# Patient Record
Sex: Male | Born: 1965 | Race: White | Hispanic: No | Marital: Married | Smoking: Never smoker
Health system: Southern US, Community
[De-identification: ages and names within clinical notes are randomized; demographics above are authoritative.]

## PROBLEM LIST (undated history)

## (undated) DIAGNOSIS — K429 Umbilical hernia without obstruction or gangrene: Secondary | ICD-10-CM

## (undated) DIAGNOSIS — M1990 Unspecified osteoarthritis, unspecified site: Secondary | ICD-10-CM

## (undated) DIAGNOSIS — Z9889 Other specified postprocedural states: Secondary | ICD-10-CM

## (undated) DIAGNOSIS — S32019A Unspecified fracture of first lumbar vertebra, initial encounter for closed fracture: Secondary | ICD-10-CM

## (undated) DIAGNOSIS — R112 Nausea with vomiting, unspecified: Secondary | ICD-10-CM

## (undated) DIAGNOSIS — I10 Essential (primary) hypertension: Secondary | ICD-10-CM

## (undated) DIAGNOSIS — J189 Pneumonia, unspecified organism: Secondary | ICD-10-CM

## (undated) DIAGNOSIS — Z8709 Personal history of other diseases of the respiratory system: Secondary | ICD-10-CM

## (undated) DIAGNOSIS — M722 Plantar fascial fibromatosis: Secondary | ICD-10-CM

## (undated) DIAGNOSIS — K4090 Unilateral inguinal hernia, without obstruction or gangrene, not specified as recurrent: Secondary | ICD-10-CM

## (undated) DIAGNOSIS — Z889 Allergy status to unspecified drugs, medicaments and biological substances status: Secondary | ICD-10-CM

## (undated) HISTORY — PX: HERNIA REPAIR: SHX51

## (undated) HISTORY — PX: WISDOM TOOTH EXTRACTION: SHX21

---

## 2006-10-23 ENCOUNTER — Ambulatory Visit (HOSPITAL_COMMUNITY): Admission: RE | Admit: 2006-10-23 | Discharge: 2006-10-23 | Payer: Self-pay | Admitting: General Surgery

## 2010-03-19 ENCOUNTER — Emergency Department (HOSPITAL_COMMUNITY): Admission: EM | Admit: 2010-03-19 | Discharge: 2010-03-19 | Payer: Self-pay | Admitting: Family Medicine

## 2011-03-29 NOTE — Op Note (Signed)
Benjamin Hogan, Benjamin Hogan             ACCOUNT NO.:  [id number]   MEDICAL RECORD NO.:  [id number]          PATIENT TYPE:  AMB   LOCATION:  DAY                          FACILITY:  [facility] [place]   PHYSICIAN:  Ollen Gross. Vernell Morgans, M.D. DATE OF BIRTH:  08/31/1966   DATE OF PROCEDURE:  10/23/2006  DATE OF DISCHARGE:                               OPERATIVE REPORT   PREOPERATIVE DIAGNOSIS:  Left inguinal hernia and umbilical hernia.   POSTOPERATIVE DIAGNOSIS:  Left inguinal hernia and umbilical hernia.   PROCEDURE:  Left inguinal hernia repair with mesh, umbilical hernia  repair with mesh.   SURGEON:  Ollen Gross. Vernell Morgans, M.D.   ANESTHESIA:  General endotracheal.   DESCRIPTION OF PROCEDURE:  After informed consent was obtained, the  patient was brought to the operating room and placed in the supine  position on the operating table.  After induction of general anesthesia,  the patient's abdomen and left groin area were prepped with Betadine and  draped in the usual sterile manner.  The left groin was then infiltrated  with 0.25% Marcaine.  An incision was made from the edge of the pubic  tubercle on the left towards the anterior superior iliac spine for a  distance of about 5-6 cm. This incision was carried down through the  skin and subcutaneous tissues sharply with electrocautery until the  fascia of the external oblique was encountered.  A small bridging vein  was clamped with hemostats, divided, and ligated with 3-0 silk ties.  The fascia of the external oblique was opened along its fibers towards  apex of the external ring with a 15 blade knife and Metzenbaum scissors.  A Weitlaner retractor was deployed.   Blunt dissection was carried out of the cord structures until the cord  structures could be surrounded between two fingers.  A 1/2-inch Penrose  drain was placed around the cord structures.  The cord was then given  gently skeletonized by a combination of blunt hemostat dissection and  sharp  dissection with electrocautery.  A hernia sac was identified at  the medial edge of the cord extending into the direct portion of the  inguinal canal.  This is a very broad based hernia sac that was easily  reduced.  The floor of the inguinal canal was opened sharply with  electrocautery and blunt finger dissection was performed to redefine the  floor of the canal. The floor of the canal was then repaired with a  running 2-0 Prolene stitch.  The tails of the stitch were left long at  the edge of the cord.  A 6 by 6 piece of Ultrapro mesh was then cut in  half and then cut to fit.  The mesh was sewn inferiorly to the shelving  edge of the inguinal ligament with a running 2-0 Prolene stitch.  The  tails of the previous Prolene were brought through the mesh at the edge  of the cord and tied down.  The tails were cut in the mesh laterally and  the tails were wrapped around the cord superiorly.  The mesh was sewn to  the  muscle aponeurotic strength layer of the transversalis with  interrupted 2-0 Prolene vertical mattress stitches and the tails of the  mesh were anchored lateral the cord to the shelving edge of the inguinal  ligament with interrupted 2-0 Prolene stitch.  Once this was  accomplished, the mesh was in good position without any tension.  The  wound was irrigated copious amounts of saline.  The external oblique was  then reapproximated with a running 2-0 Vicryl stitch.  The subcutaneous  fascia was closed with a running 3-0 Vicryl stitch and the skin was  closed with a running 4-0 Monocryl subcuticular stitch.   Attention was then turned to the umbilicus.  There was a small hernia  defect that was palpable at the umbilicus.  The periumbilical area was  infiltrated with 0.25% Marcaine.  A small incision was made vertically  through the umbilicus.  This incision was carried down through the skin  and subcutaneous tissue sharply with electrocautery.  The hernia sac was  identified and  opened sharply with electrocautery.  There were no  visceral contents within the hernia sac. The fat in the hernia sac was  reduced easily.  The fascial edges were healthy.  The subcutaneous  tissue was undermined on top of the fascia circumferentially with the  electrocautery to allow space for a piece of mesh.  The hernia defect,  itself, was repaired with interrupted #1 Novofil stitches.  A small  piece of mesh was cut to overlie this and placed in the wound.  The mesh  was anchored in several places with interrupted #1 Novofil stitches so  that the mesh was completely covering the repair. Once this was  accomplished, the wound was irrigated with copious amounts of saline.  The deep layer of the wound was closed with interrupted 3-0 Vicryl  stitches.  The skin was closed a running 4-0 Monocryl subcuticular  stitch.  Benzoin, Steri-Strips and sterile dressings were applied.  The  patient tolerated the procedure well.  At the end of the case, all  needle, sponge, and instrument counts were correct.  The patient was  then awakened and taken to the recovery room in stable condition.      Ollen Gross. Vernell Morgans, M.D.  Electronically Signed     PST/MEDQ  D:  10/24/2006  T:  10/25/2006  Job:  [id number]

## 2011-12-05 ENCOUNTER — Emergency Department (INDEPENDENT_AMBULATORY_CARE_PROVIDER_SITE_OTHER)
Admission: EM | Admit: 2011-12-05 | Discharge: 2011-12-05 | Disposition: A | Discharge status: Home / Self Care | Source: Home / Self Care | Attending: Family Medicine | Admitting: Family Medicine

## 2011-12-05 ENCOUNTER — Encounter (HOSPITAL_COMMUNITY): Payer: Self-pay | Admitting: Emergency Medicine

## 2011-12-05 DIAGNOSIS — R591 Generalized enlarged lymph nodes: Secondary | ICD-10-CM

## 2011-12-05 DIAGNOSIS — R599 Enlarged lymph nodes, unspecified: Secondary | ICD-10-CM

## 2011-12-05 HISTORY — DX: Plantar fascial fibromatosis: M72.2

## 2011-12-05 HISTORY — DX: Essential (primary) hypertension: I10

## 2011-12-05 MED ORDER — AMOXICILLIN 500 MG PO CAPS: 500.0000 mg | ORAL_CAPSULE | Freq: Three times a day (TID) | ORAL | Status: AC

## 2011-12-05 NOTE — ED Provider Notes (Signed)
History     CSN: [id number]  Arrival date & time 12/05/11  1453   First MD Initiated Contact with Patient 12/05/11 1537      Chief Complaint  Patient presents with  . Sore Throat  . Lymphadenopathy    (Consider location/radiation/quality/duration/timing/severity/associated sxs/prior treatment) HPI Comments: The patient reports having a sore throat and swelling of the left side of his neck . Had cold symptoms for a wk. No fever. Swelling started 2 days ago. No cough. No treatment pta.   The history is provided by the patient.    Past Medical History  Diagnosis Date  . Hypertension   . Plantar fasciitis     Past Surgical History  Procedure Date  . Hernia repair     History reviewed. No pertinent family history.  History  Substance Use Topics  . Smoking status: Never Smoker   . Smokeless tobacco: Not on file  . Alcohol Use: No      Review of Systems  Constitutional: Negative.   HENT: Negative for ear pain, congestion, rhinorrhea, sneezing and mouth sores.   Respiratory: Negative.   Cardiovascular: Negative.   Gastrointestinal: Negative.   Skin: Negative.     Allergies  Review of patient's allergies indicates no known allergies.  Home Medications   Current Outpatient Rx  Name Route Sig Dispense Refill  . LISINOPRIL 40 MG PO TABS Oral Take 40 mg by mouth daily.    Marland Kitchen NAPROXEN 500 MG PO TABS Oral Take 500 mg by mouth 2 (two) times daily with a meal.      BP 135/72  Pulse 80  Temp(Src) 98.4 F (36.9 C) (Oral)  Resp 18  SpO2 96%  Physical Exam  Nursing note and vitals reviewed. Constitutional: He appears well-developed and well-nourished. No distress.  HENT:  Head: Normocephalic.  Nose: Nose normal.  Mouth/Throat: Oropharynx is clear and moist.  Neck: Neck supple.       Swelling near left mandibular angle. Slight tenderness to palpation. Fullness over the parotid noted. Skin clear    ED Course  Procedures (including critical care time)  Labs  Reviewed - No data to display No results found.   1. Lymphadenopathy       MDM          Randa Spike, MD 12/05/11 [phone]

## 2011-12-05 NOTE — ED Notes (Signed)
PT HERE WITH SORE THROAT AND LEFT NECK SWELLING S/P URI X 1 WEEK AGO.PT STATES THE SWELLING STARTED X 2 DYS AGO.NO FEVER,CHILLS,N,V.

## 2012-08-18 ENCOUNTER — Encounter (HOSPITAL_COMMUNITY): Payer: Self-pay | Admitting: *Deleted

## 2012-08-18 ENCOUNTER — Emergency Department (INDEPENDENT_AMBULATORY_CARE_PROVIDER_SITE_OTHER)
Admission: EM | Admit: 2012-08-18 | Discharge: 2012-08-18 | Disposition: A | Discharge status: Home / Self Care | Source: Home / Self Care | Attending: Family Medicine | Admitting: Family Medicine

## 2012-08-18 DIAGNOSIS — S335XXA Sprain of ligaments of lumbar spine, initial encounter: Secondary | ICD-10-CM

## 2012-08-18 DIAGNOSIS — S39012A Strain of muscle, fascia and tendon of lower back, initial encounter: Secondary | ICD-10-CM

## 2012-08-18 LAB — POCT URINALYSIS DIP (DEVICE)
Ketones, ur: NEGATIVE mg/dL
Protein, ur: NEGATIVE mg/dL
Specific Gravity, Urine: 1.015 (ref 1.005–1.030)
pH: 6 (ref 5.0–8.0)

## 2012-08-18 MED ORDER — CYCLOBENZAPRINE HCL 5 MG PO TABS
5.0000 mg | ORAL_TABLET | Freq: Three times a day (TID) | ORAL | Status: DC | PRN
Start: 2012-08-18 — End: 2013-09-08

## 2012-08-18 NOTE — ED Notes (Signed)
PT reports back pain for the past 10 days that has been unrelieved with stretching, flexeril, jacuzzis, swimming. 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

## 2012-08-18 NOTE — ED Provider Notes (Signed)
History     CSN: [id number]  Arrival date & time 08/18/12  1353   First MD Initiated Contact with Patient 08/18/12 1444      Chief Complaint  Patient presents with  . Back Pain    (Consider location/radiation/quality/duration/timing/severity/associated sxs/prior treatment) Patient is a 46 y.o. male presenting with back pain. The history is provided by the patient.  Back Pain  This is a new problem. The current episode started more than 1 week ago. The problem has been gradually improving (75% improved at present.). The pain is associated with no known injury. The pain is present in the lumbar spine. The quality of the pain is described as stabbing. The pain does not radiate. The pain is mild. The symptoms are aggravated by certain positions. Pertinent negatives include no chest pain, no numbness, no abdominal pain, no bowel incontinence, no bladder incontinence, no leg pain, no paresthesias and no tingling. He has tried muscle relaxants, heat and NSAIDs for the symptoms.    Past Medical History  Diagnosis Date  . Hypertension   . Plantar fasciitis     Past Surgical History  Procedure Date  . Hernia repair     Family History  Problem Relation Age of Onset  . Family history unknown: Yes    History  Substance Use Topics  . Smoking status: Never Smoker   . Smokeless tobacco: Not on file  . Alcohol Use: No      Review of Systems  Cardiovascular: Negative for chest pain.  Gastrointestinal: Negative for abdominal pain and bowel incontinence.  Genitourinary: Negative for bladder incontinence.  Musculoskeletal: Positive for back pain.  Neurological: Negative for tingling, numbness and paresthesias.    Allergies  Review of patient's allergies indicates no known allergies.  Home Medications   Current Outpatient Rx  Name Route Sig Dispense Refill  . CYCLOBENZAPRINE HCL 10 MG PO TABS Oral Take 10 mg by mouth 3 (three) times daily as needed.    Marland Kitchen DICLOFENAC SODIUM 25 MG  PO TBEC Oral Take 25 mg by mouth 2 (two) times daily.    Marland Kitchen HYDROCHLOROTHIAZIDE 25 MG PO TABS Oral Take 25 mg by mouth daily.    Marland Kitchen LISINOPRIL 40 MG PO TABS Oral Take 40 mg by mouth daily.    . CYCLOBENZAPRINE HCL 5 MG PO TABS Oral Take 1 tablet (5 mg total) by mouth 3 (three) times daily as needed for muscle spasms. 30 tablet 0  . NAPROXEN 500 MG PO TABS Oral Take 500 mg by mouth 2 (two) times daily with a meal.      Pulse 69  Temp 98.6 F (37 C) (Oral)  Resp 16  SpO2 98%  Physical Exam  Nursing note and vitals reviewed. Constitutional: He is oriented to person, place, and time. He appears well-developed and well-nourished. No distress.  Neck: Normal range of motion. Neck supple.  Abdominal: Soft. Bowel sounds are normal.  Musculoskeletal: He exhibits tenderness.       Lumbar back: He exhibits tenderness and spasm.       Back:  Neurological: He is alert and oriented to person, place, and time. He has normal reflexes.  Skin: Skin is warm and dry.    ED Course  Procedures (including critical care time)   Labs Reviewed  POCT URINALYSIS DIP (DEVICE)   No results found.   1. Strain of lumbar paraspinous muscle       MDM  U/a neg        Linna Hoff,  MD 08/18/12 1529

## 2013-09-08 ENCOUNTER — Encounter: Payer: Self-pay | Admitting: Internal Medicine

## 2013-09-08 ENCOUNTER — Ambulatory Visit (INDEPENDENT_AMBULATORY_CARE_PROVIDER_SITE_OTHER): Admitting: Internal Medicine

## 2013-09-08 VITALS — BP 132/90 | HR 55 | Ht 76.0 in | Wt 299.4 lb

## 2013-09-08 DIAGNOSIS — R5381 Other malaise: Secondary | ICD-10-CM

## 2013-09-08 DIAGNOSIS — R6889 Other general symptoms and signs: Secondary | ICD-10-CM

## 2013-09-08 DIAGNOSIS — Z8249 Family history of ischemic heart disease and other diseases of the circulatory system: Secondary | ICD-10-CM

## 2013-09-08 DIAGNOSIS — R5383 Other fatigue: Secondary | ICD-10-CM

## 2013-09-08 DIAGNOSIS — I10 Essential (primary) hypertension: Secondary | ICD-10-CM

## 2013-09-08 DIAGNOSIS — R0609 Other forms of dyspnea: Secondary | ICD-10-CM

## 2013-09-08 NOTE — Progress Notes (Signed)
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 He is followed by the [place] in [place] and was referred for increasing shortness of breath and decreased exercise tolerance. His family history is significant for heart disease including a father who has hypertension a mother with a failed heart failure dyslipidemia and a history of hypertension and heart disease in his sisters. He is really noticed some increased weight gain difficulty in exercising with increasing shortness of breath and  Markedly decreased exercise tolerance. He has to continue to undergo tests are physical fitness with the [employer] and his performance has continued to decline. He also notes that normal activities make him more short of breath than he had in the past. He has no history of smoking and denies alcohol recreational drug use. He has been deployed to [place] but denies any chemical or infectious agent exposure. He also does not have any typical anginal symptoms. I also reviewed her lipid profile recently from the [place] which showed total cholesterol 166 and LDL cholesterol of 100 - is not on statin medications.  PMHx:  Past Medical History  Diagnosis Date  . Hypertension   . Plantar fasciitis     Past Surgical History  Procedure Laterality Date  . Hernia repair      FAMHx:  Family History  Problem Relation Age of Onset  . Atrial fibrillation Mother   . Heart failure Mother   . Hypertension Mother   . Hyperlipidemia Mother   . Heart disease Mother   . Hypertension Father   . Hyperlipidemia Father   . Hypertension Sister   . Hyperlipidemia Brother     SOCHx:   reports that he has never smoked.  He has never used smokeless tobacco. He reports that he drinks alcohol. He reports that he does not use illicit drugs.  ALLERGIES:  No Known Allergies  ROS: A comprehensive review of systems was negative except for: Constitutional: positive for fatigue Respiratory: positive for dyspnea on exertion  HOME MEDS: Current Outpatient Prescriptions  Medication Sig Dispense Refill  . cyclobenzaprine (FLEXERIL) 10 MG tablet Take 10 mg by mouth 3 (three) times daily as needed.      . diclofenac (VOLTAREN) 25 MG EC tablet Take 75 mg by mouth 2 (two) times daily as needed.       . fexofenadine (ALLEGRA) 180 MG tablet Take 180 mg by mouth daily.      . hydrochlorothiazide (HYDRODIURIL) 25 MG tablet Take 25 mg by mouth daily.      Marland Kitchen lisinopril (PRINIVIL,ZESTRIL) 40 MG tablet Take 40 mg by mouth daily.       No current facility-administered medications for this visit.    LABS/IMAGING: No results found for this or any previous visit (from the past 48 hour(s)). No results found.  VITALS: BP 132/90  Pulse 55  Ht 6\' 4"  (1.93 m)  Wt 299 lb 6.4 oz (135.807 kg)  BMI 36.46 kg/m2  EXAM: General appearance: alert, no distress and appears muscular Neck: no carotid bruit and no JVD Lungs: clear to auscultation bilaterally Heart: regular rate and rhythm, S1, S2 normal and systolic murmur: early systolic 2/6, crescendo at 2nd right  intercostal space Abdomen: soft, non-tender; bowel sounds normal; no masses,  no organomegaly Extremities: extremities normal, atraumatic, no cyanosis or edema Pulses: 2+ and symmetric Skin: Skin color, texture, turgor normal. No rashes or lesions Neurologic: Grossly normal Psych: Mood, affect normal  EKG: Sinus bradycardia at 55  ASSESSMENT: 1. Decreased exercise tolerance and increasing shortness of breath with exertion 2. Fatigue 3. Recent weight gain 4. Hypertension-controlled  PLAN: 1.   Benjamin Hogan is complaining of decreased exercise tolerance and  increased shortness of breath with exertion over the past several months. 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 There is history on family history of heart disease and he does have risk factors such as hypertension and age. He's never had any stress testing before and desires to start an exercise program. Would like for him to undergo an exercise nuclear stress test. This goes a good idea about his exercise capacity to be helpful in excluding possible coronary disease.  Contacted with results of the stress test and plan to follow him annually at least for his factor modification.  Chrystie Nose, MD, [facility] Attending Cardiologist [facility]  Benjamin Hogan,Benjamin Hogan 09/08/2013, 9:54 AM

## 2013-09-08 NOTE — Patient Instructions (Signed)
Your physician has requested that you have en exercise stress myoview. For further information please visit [email or web address] Please follow instruction sheet, as given. We will call you with the results.  Your physician wants you to follow-up in: 1 year. You will receive a reminder letter in the mail two months in advance. If you don't receive a letter, please call our office to schedule the follow-up appointment.

## 2013-09-10 ENCOUNTER — Ambulatory Visit (HOSPITAL_COMMUNITY)
Admission: RE | Admit: 2013-09-10 | Discharge: 2013-09-10 | Disposition: A | Discharge status: Home / Self Care | Source: Ambulatory Visit | Attending: Internal Medicine | Admitting: Internal Medicine

## 2013-09-10 DIAGNOSIS — R5381 Other malaise: Secondary | ICD-10-CM | POA: Insufficient documentation

## 2013-09-10 DIAGNOSIS — R0989 Other specified symptoms and signs involving the circulatory and respiratory systems: Secondary | ICD-10-CM | POA: Insufficient documentation

## 2013-09-10 DIAGNOSIS — R5383 Other fatigue: Secondary | ICD-10-CM

## 2013-09-10 DIAGNOSIS — I10 Essential (primary) hypertension: Secondary | ICD-10-CM | POA: Insufficient documentation

## 2013-09-10 DIAGNOSIS — R0609 Other forms of dyspnea: Secondary | ICD-10-CM | POA: Insufficient documentation

## 2013-09-10 MED ORDER — TECHNETIUM TC 99M SESTAMIBI GENERIC - CARDIOLITE
10.0000 | Freq: Once | INTRAVENOUS | Status: AC | PRN
  Administered 2013-09-10: 10 via INTRAVENOUS

## 2013-09-10 MED ORDER — TECHNETIUM TC 99M SESTAMIBI GENERIC - CARDIOLITE
30.0000 | Freq: Once | INTRAVENOUS | Status: AC | PRN
  Administered 2013-09-10: 30 via INTRAVENOUS

## 2013-09-10 NOTE — Procedures (Addendum)
[facility] [address] [place] [address] [place] [address] [phone]  Cardiology Nuclear Med Study  Benjamin Hogan is a 47 y.o. male     MRN : [id number]     DOB: 07-14-1966  Procedure Date: 09/10/2013  Nuclear Med Background Indication for Stress Test:  Evaluation for Ischemia and Abnormal EKG History:  No prior history reported. Cardiac Risk Factors: Family History - CAD, Hypertension, Lipids and Overweight  Symptoms:  Dizziness, DOE, Fatigue and Light-Headedness   Nuclear Pre-Procedure Caffeine/Decaff Intake:  7:00pm NPO After: 5:00am   IV Site: R Hand  IV 0.9% NS with Angio Cath:  22g  Chest Size (in):  44"  IV Started by: Emmit Pomfret, RN  Height: 6\' 4"  (1.93 m)  Cup Size: n/a  BMI:  Body mass index is 36.41 kg/(m^2). Weight:  299 lb (135.626 kg)   Tech Comments:  n/a    Nuclear Med Study 1 or 2 day study: 1 day  Stress Test Type:  Stress  Order Authorizing Provider:  Zoila Shutter, MD   Resting Radionuclide: Technetium 24m Sestamibi  Resting Radionuclide Dose: 10.4 mCi   Stress Radionuclide:  Technetium 92m Sestamibi  Stress Radionuclide Dose: 31.5 mCi           Stress Protocol Rest HR: 54 Stress HR:151  Rest BP: 121/79 Stress BP:191/53  Exercise Time (min): 12:36 METS: 13.40   Predicted Max HR: 175 bpm % Max HR: 86.29 bpm Rate Pressure Product: 46962  Dose of Adenosine (mg):  n/a Dose of Lexiscan: 0.4 mg  Dose of Atropine (mg): n/a Dose of Dobutamine: n/a mcg/kg/min (at max HR)  Stress Test Technologist: Ernestene Mention, CCT Nuclear Technologist: Koren Shiver, CNMT   Rest Procedure:  Myocardial perfusion imaging was performed at rest 45 minutes following the intravenous administration of Technetium 80m Sestamibi. Stress Procedure:  The patient performed treadmill exercise using a Bruce  Protocol for 12 minutes and 36 seconds. The patient stopped due to shortness of breath and fatigue. Patient denied any  chest pain.  There were no significant ST-T wave changes.  Technetium 32m Sestamibi was injected at peak exercise and myocardial perfusion imaging was performed after a brief delay.  Transient Ischemic Dilatation (Normal <1.22):  1.09 Lung/Heart Ratio (Normal <0.45):  0.42 QGS EDV:  166 ml QGS ESV:  79 ml LV Ejection Fraction: 52%     Rest ECG: NSR with non-specific ST-T wave changes  Stress ECG: No significant change from baseline ECG  QPS Raw Data Images:  Normal; no motion artifact; normal heart/lung ratio. Cavity to wall ratio appears increased, although overall LV volume is not increased. Stress Images:  Normal homogeneous uptake in all areas of the myocardium. Rest Images:  Normal homogeneous uptake in all areas of the myocardium. Subtraction (SDS):  No evidence of ischemia. LV Wall Motion:  Mild global hypokinesis and mildly depressed overall systolic function. EF 52%.  Impression Exercise Capacity:  Good exercise capacity. BP Response:  Hypertensive blood pressure response. Clinical Symptoms:  No significant symptoms noted. ECG Impression:  Insignificant upsloping ST segment depression. Comparison with Prior Nuclear Study: No previous nuclear study performed   Overall Impression:  Normal stress nuclear study. 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   Thurmon Fair, MD  09/10/2013 12:40 PM

## 2015-03-27 ENCOUNTER — Ambulatory Visit (INDEPENDENT_AMBULATORY_CARE_PROVIDER_SITE_OTHER): Admitting: Physician Assistant

## 2015-03-27 VITALS — BP 130/84 | HR 83 | Temp 98.0°F | Resp 16 | Ht 76.0 in | Wt 316.0 lb

## 2015-03-27 DIAGNOSIS — M722 Plantar fascial fibromatosis: Secondary | ICD-10-CM | POA: Insufficient documentation

## 2015-03-27 DIAGNOSIS — L03011 Cellulitis of right finger: Secondary | ICD-10-CM | POA: Diagnosis not present

## 2015-03-27 DIAGNOSIS — S22000A Wedge compression fracture of unspecified thoracic vertebra, initial encounter for closed fracture: Secondary | ICD-10-CM | POA: Diagnosis not present

## 2015-03-27 MED ORDER — DOXYCYCLINE HYCLATE 100 MG PO CAPS: 100.0000 mg | ORAL_CAPSULE | Freq: Two times a day (BID) | ORAL | Status: AC

## 2015-03-27 MED ORDER — DICLOFENAC SODIUM 25 MG PO TBEC: 75.0000 mg | DELAYED_RELEASE_TABLET | Freq: Two times a day (BID) | ORAL | Status: DC | PRN

## 2015-03-27 NOTE — Progress Notes (Signed)
Subjective:   Patient ID: Benjamin Hogan, male     DOB: Apr 10, 1966, 49 y.o.    MRN: [id number]  PCP: Pcp Not In System  Chief Complaint  Patient presents with  . Wound Infection    R thumb x 1 wk    HPI  Presents for evaluation of a painful RIGHT thumb.  About a week ago, he accidentally tore the medial nailfold. He's been caring for it at home but it's not improving. 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 The area is red and swollen, but he's had no purulent drainage, no fever/chills.  His PCP is at the [place], and he requests a refill of Diclofenac, which he uses PRN for back pain.  Tdap was 2013.  Prior to Admission medications   Medication Sig Start Date End Date Taking? Authorizing Provider  diclofenac (VOLTAREN) 25 MG EC tablet Take 3 tablets (75 mg total) by mouth 2 (two) times daily as needed. 03/27/15  Yes Marquelle Balow Dellis Filbert, PA-C  fexofenadine (ALLEGRA) 180 MG tablet Take 180 mg by mouth daily.   Yes Historical Provider, MD  hydrochlorothiazide (HYDRODIURIL) 25 MG tablet Take 25 mg by mouth daily.   Yes Historical Provider, MD  lisinopril (PRINIVIL,ZESTRIL) 40 MG tablet Take 40 mg by mouth daily.   Yes Historical Provider, MD  doxycycline (VIBRAMYCIN) 100 MG capsule Take 1 capsule (100 mg total) by mouth 2 (two) times daily. 03/27/15 04/06/15  Daphane Shepherd, PA-C     No Known Allergies   Patient Active Problem List   Diagnosis Date Noted  . Compression fx, thoracic spine 03/27/2015  . Plantar fasciitis, bilateral 03/27/2015  . DOE (dyspnea on exertion) 09/08/2013  . Decreased exercise tolerance 09/08/2013  . HTN (hypertension) 09/08/2013  . Family history of premature CAD 09/08/2013     Family History  Problem Relation Age of Onset  . Atrial fibrillation Mother   . Heart failure Mother   . Hypertension Mother   . Hyperlipidemia Mother   . Heart disease Mother   . Diabetes Mother   . Hypertension Father   . Hyperlipidemia  Father   . Hypertension Sister   . Hyperlipidemia Brother   . Diabetes Maternal Grandmother   . Heart disease Maternal Grandmother   . Hyperlipidemia Maternal Grandmother   . Hypertension Maternal Grandmother   . Stroke Maternal Grandmother   . Heart disease Maternal Grandfather   . Hypertension Paternal Grandmother   . Stroke Paternal Grandfather   . Hypertension Sister      History   Social History  . Marital Status: Married    Spouse Name: Ja Ohman  . Number of Children: 2  . Years of Education: N/A   Occupational History  . ICU Nurse     [place] History Main Topics  . Smoking status: Never Smoker   . Smokeless tobacco: Never Used  . Alcohol Use: Yes     Comment: occasionally  . Drug Use: No  . Sexual Activity: Not on file   Other Topics Concern  . Not on file   Social History Narrative   Lives with his wife and their daughters, [place]quel [place] and [place].        Review of Systems  Constitutional: Negative for fever, chills and fatigue.  Musculoskeletal: Negative for myalgias, joint swelling and arthralgias.  Skin: Positive for wound.  Neurological: Negative for numbness.         Objective:  Physical Exam  Constitutional: He is oriented to person, place, and time. He appears well-developed and well-nourished. He is active and cooperative. No distress.  BP 130/84 mmHg  Pulse 83  Temp(Src) 98 F (36.7 C) (Oral)  Resp 16  Ht 6\' 4"  (1.93 m)  Wt 316 lb (143.337 kg)  BMI 38.48 kg/m2  SpO2 98%   Eyes: Conjunctivae are normal.  Pulmonary/Chest: Effort normal.  Musculoskeletal:       Right hand: He exhibits normal range of motion, no tenderness and no bony tenderness. Normal sensation noted. Normal strength noted.  Erythema of the nail fold, wound consistent with avulsion of skin. Tender. Mild surrounding erythema. No induration. No red streaking.  Neurological: He is alert and oriented to person, place, and time.  Psychiatric: He has a  normal mood and affect. His speech is normal and behavior is normal.             Assessment & Plan:  1. Paronychia of right thumb Secondary to accidental trauma. Local wound care. Anticipatory guidance.  - doxycycline (VIBRAMYCIN) 100 MG capsule; Take 1 capsule (100 mg total) by mouth 2 (two) times daily.  Dispense: 20 capsule; Refill: 0  2. Compression fx, thoracic spine, closed, initial encounter Stable. - diclofenac (VOLTAREN) 25 MG EC tablet; Take 3 tablets (75 mg total) by mouth 2 (two) times daily as needed.  Dispense: 60 tablet; Refill: 0   Fara Chute, PA-C Physician Assistant-Certified [facility]

## 2015-03-27 NOTE — Patient Instructions (Addendum)
Complete the antibiotic as prescribed. Apply a warm compress to the area for 15-20 minutes 2-4 times each day. Cover when you are at work, but make sure that the wound is getting plenty of fresh air!

## 2015-06-06 ENCOUNTER — Ambulatory Visit: Payer: Self-pay | Admitting: Surgery

## 2015-06-06 NOTE — H&P (Signed)
History of Present Illness Benjamin Hogan. Benjamin Schwinn MD; 06/06/2015 12:21 PM) The patient is a 49 year old male who presents with an inguinal hernia. Patient referred by Dr. Jackson Latino for evaluation of recurrent left inguinal hernia. 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 In 2007 he underwent open left inguinal hernia repair with mesh by Dr. Marlou Starks. Apparently he had a recurrence and in 2009 he underwent laparoscopic bilateral inguinal hernia repairs with mesh by an Multimedia programmer at [facility]. Over the last year or so he has noticed some tenderness in his left groin. 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 The MRI at that time showed a stress fracture of the left pubic bone with significant edema around the pubic symphysis. It also showed a recurrent hernia on the left. It is unclear whether the hernia occurred at the time of the accident. This area has become more tender and he continues to be able to reduce this area. He denies any obstructive symptoms. He presents now to discuss a another repair. The patient's father as well as his brother have both had multiple hernia repairs and recurrences.   Other Problems Elbert Ewings, CMA; 06/06/2015 9:38 AM) Back Pain High blood pressure Inguinal Hernia Umbilical Hernia Repair  Past Surgical History Elbert Ewings, CMA; 06/06/2015 9:38 AM) Laparoscopic Inguinal Hernia Surgery Bilateral. Open Inguinal Hernia Surgery Left. Oral Surgery Ventral / Umbilical Hernia Surgery Bilateral.  Diagnostic Studies History Elbert Ewings, CMA; 06/06/2015 9:38 AM) Colonoscopy never  Allergies Elbert Ewings, CMA; 06/06/2015 9:38 AM) No Known Drug Allergies07/26/2016  Medication History Elbert Ewings, CMA; 06/06/2015 9:40 AM) Lisinopril (40MG  Tablet, Oral)  Active. Hydrochlorothiazide (25MG  Tablet, Oral) Active. EpiPen 2-Pak (0.3MG /0.3ML Soln Auto-inj, Injection AS needed) Active. Diclofenac Sodium ER (100MG  Tablet ER 24HR, Oral) Active. Allegra (180MG  Tablet, Oral) Active. Medications Reconciled  Social History Elbert Ewings, [place]; 06/06/2015 9:38 AM) Alcohol use Occasional alcohol use. Caffeine use Carbonated beverages. No drug use Tobacco use Never smoker.  Family History Elbert Ewings, [place]; 06/06/2015 9:38 AM) Cancer Mother. Diabetes Mellitus Mother. Heart Disease Mother. Hypertension Father, Mother, Sister. Kidney Disease Mother.  Review of Systems Elbert Ewings CMA; 06/06/2015 9:38 AM) General Not Present- Appetite Loss, Chills, Fatigue, Fever, Night Sweats, Weight Gain and Weight Loss. Skin Not Present- Change in Wart/Mole, Dryness, Hives, Jaundice, New Lesions, Non-Healing Wounds, Rash and Ulcer. HEENT Present- Hearing Loss, Ringing in the Ears, Seasonal Allergies and Wears glasses/contact lenses. Not Present- Earache, Hoarseness, Nose Bleed, Oral Ulcers, Sinus Pain, Sore Throat, Visual Disturbances and Yellow Eyes. Respiratory Present- Snoring. Not Present- Bloody sputum, Chronic Cough, Difficulty Breathing and Wheezing. Cardiovascular Not Present- Chest Pain, Difficulty Breathing Lying Down, Leg Cramps, Palpitations, Rapid Heart Rate, Shortness of Breath and Swelling of Extremities. Gastrointestinal Not Present- Abdominal Pain, Bloating, Bloody Stool, Change in Bowel Habits, Chronic diarrhea, Constipation, Difficulty Swallowing, Excessive gas, Gets full quickly at meals, Hemorrhoids, Indigestion, Nausea, Rectal Pain and Vomiting. Male Genitourinary Not Present- Blood in Urine, Change in Urinary Stream, Frequency, Impotence, Nocturia, Painful Urination, Urgency and Urine Leakage. Musculoskeletal Present- Back Pain and Joint Pain. Not Present- Joint Stiffness, Muscle Pain, Muscle Weakness and Swelling of  Extremities. Neurological Not Present- Decreased Memory, Fainting, Headaches, Numbness, Seizures, Tingling, Tremor, Trouble walking and Weakness. Psychiatric Not Present- Anxiety, Bipolar, Change in Sleep Pattern, Depression, Fearful and Frequent crying.  Endocrine Not Present- Cold Intolerance, Excessive Hunger, Hair Changes, Heat Intolerance, Hot flashes and New Diabetes. Hematology Not Present- Easy Bruising, Excessive bleeding, Gland problems, HIV and Persistent Infections.   Vitals Elbert Ewings CMA; 06/06/2015 9:40 AM) 06/06/2015 9:40 AM Weight: 321 lb Height: 76in Body Surface Area: 2.79 m Body Mass Index: 39.07 kg/m Temp.: 98.83F(Oral)  Pulse: 74 (Regular)  BP: 138/80 (Sitting, Left Arm, Standard)    Physical Exam Rodman Key K. Tarena Gockley MD; 06/06/2015 12:22 PM) The physical exam findings are as follows: Note:WDWN in NAD HEENT: EOMI, sclera anicteric Neck: No masses, no thyromegaly Lungs: CTA bilaterally; normal respiratory effort CV: Regular rate and rhythm; no murmurs Abd: +bowel sounds, soft, non-tender, healed umbilical incision GU: bilateral descended testes; no testicular masses; no sign of right inguinal hernia; reducible left inguinal hernia Ext: Well-perfused; no edema Skin: Warm, dry; no sign of jaundice    Assessment & Plan Rodman Key K. 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 The surgical procedure has been discussed with the patient. Potential risks, benefits, alternative treatments, and expected outcomes have been explained. All of the patient's questions at this time have been answered. The likelihood of reaching the patient's treatment goal is good. The patient understand the proposed surgical procedure and wishes to proceed.   Benjamin Hogan. Georgette Dover, MD, [facility]  06/06/2015 2:58 PM

## 2015-07-07 NOTE — Pre-Procedure Instructions (Signed)
Benjamin Hogan  07/07/2015      [id number] [address], [place] [address] Phone: [phone] Fax: [phone]    Your procedure is scheduled on Tuesday, July 18, 2015  Report to [facility] [place] Admitting at 9:30 A.M.  Call this number if you have problems the morning of surgery:  [phone]   Call this number if you have problems in the days leading up to your surgery:  [phone]    Remember:  Do not eat food or drink liquids after midnight Monday, July 17, 2015  Take these medicines the morning of surgery with A SIP OF WATER : if needed: fexofenadine  (ALLEGRA) for allergies Stop taking Aspirin, vitamins and herbal medications. Do not take any NSAIDs ie: Ibuprofen, Advil, Naproxen or any medication containing Aspirin such as Diclofenac; stop 1 week prior to procedure Tuesday, July 11, 2015  Do not wear jewelry.  Do not wear lotions, powders, or perfumes.  You may not wear deodorant.  Do not shave 48 hours prior to surgery.  Men may shave face and neck.  Do not bring valuables to the hospital.  [facility] is not responsible for any belongings or valuables.  Contacts, dentures or bridgework may not be worn into surgery.  Leave your suitcase in the car.  After surgery it may be brought to your room.  For patients admitted to the hospital, discharge time will be determined by your treatment team.  Patients discharged the day of surgery will not be allowed to drive home.   Name and phone number of your driver:  Special instructions: [facility] - Preparing for Surgery  Before surgery, you can play an important role.  Because skin is not sterile, your skin needs to be as free of germs as possible.  You can reduce the number of germs on you skin by washing with CHG (chlorahexidine gluconate) soap before surgery.  CHG is an antiseptic cleaner which kills germs and bonds with the skin to  continue killing germs even after washing.  Please DO NOT use if you have an allergy to CHG or antibacterial soaps.  If your skin becomes reddened/irritated stop using the CHG and inform your nurse when you arrive at Short Stay.  Do not shave (including legs and underarms) for at least 48 hours prior to the first CHG shower.  You may shave your face.  Please follow these instructions carefully:   1.  Shower with CHG Soap the night before surgery and the morning of Surgery.  2.  If you choose to wash your hair, wash your hair first as usual with your normal shampoo.  3.  After you shampoo, rinse your hair and body thoroughly to remove the Shampoo.  4.  Use CHG as you would any other liquid soap.  You can apply chg directly  to the skin and wash gently with scrungie or a clean washcloth.  5.  Apply the CHG Soap to your body ONLY FROM THE NECK DOWN.  Do not use on open wounds or open sores.  Avoid contact with your eyes, ears, mouth and genitals (private parts).  Wash genitals (private parts) with your normal soap.  6.  Wash thoroughly, paying special attention to the area where your surgery will be performed.  7.  Thoroughly rinse your body with warm water from the neck down.  8.  DO NOT shower/wash with your normal soap after using and  rinsing off the CHG Soap.  9.  Pat yourself dry with a clean towel.            10.  Wear clean pajamas.            11.  Place clean sheets on your bed the night of your first shower and do not sleep with pets.  Day of Surgery  Do not apply any lotions/deodorants the morning of surgery.  Please wear clean clothes to the hospital/surgery center.  Please read over the following fact sheets that you were given. Pain Booklet, Coughing and Deep Breathing and Surgical Site Infection Prevention

## 2015-07-10 ENCOUNTER — Encounter (HOSPITAL_COMMUNITY): Payer: Self-pay

## 2015-07-10 ENCOUNTER — Encounter (HOSPITAL_COMMUNITY)
Admission: RE | Admit: 2015-07-10 | Discharge: 2015-07-10 | Disposition: A | Discharge status: Home / Self Care | Source: Ambulatory Visit | Attending: Surgery | Admitting: Surgery

## 2015-07-10 DIAGNOSIS — R001 Bradycardia, unspecified: Secondary | ICD-10-CM | POA: Insufficient documentation

## 2015-07-10 DIAGNOSIS — Z01812 Encounter for preprocedural laboratory examination: Secondary | ICD-10-CM | POA: Insufficient documentation

## 2015-07-10 DIAGNOSIS — K4090 Unilateral inguinal hernia, without obstruction or gangrene, not specified as recurrent: Secondary | ICD-10-CM | POA: Insufficient documentation

## 2015-07-10 HISTORY — DX: Other specified postprocedural states: Z98.890

## 2015-07-10 HISTORY — DX: Personal history of other diseases of the respiratory system: Z87.09

## 2015-07-10 HISTORY — DX: Other specified postprocedural states: R11.2

## 2015-07-10 HISTORY — DX: Umbilical hernia without obstruction or gangrene: K42.9

## 2015-07-10 HISTORY — DX: Pneumonia, unspecified organism: J18.9

## 2015-07-10 HISTORY — DX: Allergy status to unspecified drugs, medicaments and biological substances: Z88.9

## 2015-07-10 HISTORY — DX: Unilateral inguinal hernia, without obstruction or gangrene, not specified as recurrent: K40.90

## 2015-07-10 HISTORY — DX: Unspecified fracture of first lumbar vertebra, initial encounter for closed fracture: S32.019A

## 2015-07-10 HISTORY — DX: Unspecified osteoarthritis, unspecified site: M19.90

## 2015-07-10 LAB — BASIC METABOLIC PANEL
Anion gap: 6 (ref 5–15)
BUN: 24 mg/dL — AB (ref 6–20)
CALCIUM: 8.9 mg/dL (ref 8.9–10.3)
CO2: 26 mmol/L (ref 22–32)
CREATININE: 1.17 mg/dL (ref 0.61–1.24)
Chloride: 105 mmol/L (ref 101–111)
GFR calc Af Amer: >60 mL/min (ref >60)
Glucose, Bld: 98 mg/dL (ref 65–99)
POTASSIUM: 4 mmol/L (ref 3.5–5.1)
SODIUM: 137 mmol/L (ref 135–145)

## 2015-07-10 LAB — CBC
HCT: 41.7 % (ref 39.0–52.0)
Hemoglobin: 14.6 g/dL (ref 13.0–17.0)
MCH: 31.3 pg (ref 26.0–34.0)
MCHC: 35 g/dL (ref 30.0–36.0)
MCV: 89.5 fL (ref 78.0–100.0)
PLATELETS: 240 10*3/uL (ref 150–400)
RBC: 4.66 MIL/uL (ref 4.22–5.81)
RDW: 12.8 % (ref 11.5–15.5)
WBC: 5.6 10*3/uL (ref 4.0–10.5)

## 2015-07-10 NOTE — Progress Notes (Signed)
   07/10/15 8889  OBSTRUCTIVE SLEEP APNEA  Have you ever been diagnosed with sleep apnea through a sleep study? No  Do you snore loudly (loud enough to be heard through closed doors)?  1  Do you often feel tired, fatigued, or sleepy during the daytime? 0  Has anyone observed you stop breathing during your sleep? 0  Do you have, or are you being treated for high blood pressure? 1  BMI more than 35 kg/m2? 1  Age over 47 years old? 0  Neck circumference greater than 40 cm/16 inches? (19)  Gender: 1

## 2015-07-17 MED ORDER — DEXTROSE 5 % IV SOLN
3.0000 g | INTRAVENOUS | Status: AC
  Administered 2015-07-18: 3 g via INTRAVENOUS
  Filled 2015-07-17: qty 3000

## 2015-07-18 ENCOUNTER — Ambulatory Visit (HOSPITAL_COMMUNITY): Admitting: Anesthesiology

## 2015-07-18 ENCOUNTER — Encounter (HOSPITAL_COMMUNITY)
Admission: RE | Disposition: A | Discharge status: Home / Self Care | Payer: Self-pay | Source: Ambulatory Visit | Attending: Surgery

## 2015-07-18 ENCOUNTER — Ambulatory Visit (HOSPITAL_COMMUNITY)
Admission: RE | Admit: 2015-07-18 | Discharge: 2015-07-18 | Disposition: A | Discharge status: Home / Self Care | Source: Ambulatory Visit | Attending: Surgery | Admitting: Surgery

## 2015-07-18 ENCOUNTER — Encounter (HOSPITAL_COMMUNITY): Payer: Self-pay | Admitting: Certified Registered Nurse Anesthetist

## 2015-07-18 DIAGNOSIS — Z79899 Other long term (current) drug therapy: Secondary | ICD-10-CM | POA: Insufficient documentation

## 2015-07-18 DIAGNOSIS — Z6838 Body mass index (BMI) 38.0-38.9, adult: Secondary | ICD-10-CM | POA: Diagnosis not present

## 2015-07-18 DIAGNOSIS — I10 Essential (primary) hypertension: Secondary | ICD-10-CM | POA: Insufficient documentation

## 2015-07-18 DIAGNOSIS — K4090 Unilateral inguinal hernia, without obstruction or gangrene, not specified as recurrent: Secondary | ICD-10-CM | POA: Diagnosis not present

## 2015-07-18 HISTORY — PX: INGUINAL HERNIA REPAIR: SHX194

## 2015-07-18 SURGERY — REPAIR, HERNIA, INGUINAL, ADULT
Anesthesia: General | Site: Groin | Laterality: Left

## 2015-07-18 MED ORDER — DEXAMETHASONE SODIUM PHOSPHATE 4 MG/ML IJ SOLN
INTRAMUSCULAR | Status: AC
  Filled 2015-07-18: qty 2

## 2015-07-18 MED ORDER — ROCURONIUM BROMIDE 50 MG/5ML IV SOLN
INTRAVENOUS | Status: AC
  Filled 2015-07-18: qty 1

## 2015-07-18 MED ORDER — ROCURONIUM BROMIDE 100 MG/10ML IV SOLN
INTRAVENOUS | Status: DC | PRN
  Administered 2015-07-18: 10 mg via INTRAVENOUS
  Administered 2015-07-18: 40 mg via INTRAVENOUS

## 2015-07-18 MED ORDER — GLYCOPYRROLATE 0.2 MG/ML IJ SOLN
INTRAMUSCULAR | Status: DC | PRN
  Administered 2015-07-18: 0.4 mg via INTRAVENOUS

## 2015-07-18 MED ORDER — HYDROCODONE-ACETAMINOPHEN 5-325 MG PO TABS: 1.0000 | ORAL_TABLET | ORAL | Status: DC | PRN

## 2015-07-18 MED ORDER — HYDROMORPHONE HCL 1 MG/ML IJ SOLN
0.2500 mg | INTRAMUSCULAR | Status: DC | PRN
  Administered 2015-07-18 (×3): 0.5 mg via INTRAVENOUS

## 2015-07-18 MED ORDER — FENTANYL CITRATE (PF) 100 MCG/2ML IJ SOLN
INTRAMUSCULAR | Status: DC | PRN
  Administered 2015-07-18: 100 ug via INTRAVENOUS
  Administered 2015-07-18 (×2): 50 ug via INTRAVENOUS

## 2015-07-18 MED ORDER — ONDANSETRON HCL 4 MG/2ML IJ SOLN
INTRAMUSCULAR | Status: AC
  Filled 2015-07-18: qty 2

## 2015-07-18 MED ORDER — BUPIVACAINE-EPINEPHRINE 0.25% -1:200000 IJ SOLN
INTRAMUSCULAR | Status: DC | PRN
  Administered 2015-07-18: 10 mL

## 2015-07-18 MED ORDER — ONDANSETRON HCL 4 MG/2ML IJ SOLN
4.0000 mg | Freq: Once | INTRAMUSCULAR | Status: AC
  Administered 2015-07-18: 4 mg via INTRAVENOUS

## 2015-07-18 MED ORDER — STERILE WATER FOR INJECTION IJ SOLN
INTRAMUSCULAR | Status: AC
  Filled 2015-07-18: qty 10

## 2015-07-18 MED ORDER — HYDROMORPHONE HCL 1 MG/ML IJ SOLN
INTRAMUSCULAR | Status: AC
  Filled 2015-07-18: qty 1

## 2015-07-18 MED ORDER — ONDANSETRON HCL 4 MG/2ML IJ SOLN
INTRAMUSCULAR | Status: DC | PRN
  Administered 2015-07-18: 4 mg via INTRAVENOUS

## 2015-07-18 MED ORDER — DEXAMETHASONE SODIUM PHOSPHATE 4 MG/ML IJ SOLN
INTRAMUSCULAR | Status: DC | PRN
  Administered 2015-07-18: 8 mg via INTRAVENOUS

## 2015-07-18 MED ORDER — LIDOCAINE HCL (CARDIAC) 20 MG/ML IV SOLN
INTRAVENOUS | Status: DC | PRN
  Administered 2015-07-18: 80 mg via INTRAVENOUS

## 2015-07-18 MED ORDER — NEOSTIGMINE METHYLSULFATE 10 MG/10ML IV SOLN
INTRAVENOUS | Status: DC | PRN
  Administered 2015-07-18: 3 mg via INTRAVENOUS

## 2015-07-18 MED ORDER — MORPHINE SULFATE (PF) 2 MG/ML IV SOLN: 2.0000 mg | INTRAVENOUS | Status: DC | PRN

## 2015-07-18 MED ORDER — LACTATED RINGERS IV SOLN
INTRAVENOUS | Status: DC | PRN
  Administered 2015-07-18 (×2): via INTRAVENOUS

## 2015-07-18 MED ORDER — MIDAZOLAM HCL 5 MG/5ML IJ SOLN
INTRAMUSCULAR | Status: DC | PRN
  Administered 2015-07-18: 2 mg via INTRAVENOUS

## 2015-07-18 MED ORDER — LIDOCAINE HCL (CARDIAC) 20 MG/ML IV SOLN
INTRAVENOUS | Status: AC
  Filled 2015-07-18: qty 5

## 2015-07-18 MED ORDER — 0.9 % SODIUM CHLORIDE (POUR BTL) OPTIME
TOPICAL | Status: DC | PRN
  Administered 2015-07-18: 1000 mL

## 2015-07-18 MED ORDER — SUCCINYLCHOLINE CHLORIDE 20 MG/ML IJ SOLN
INTRAMUSCULAR | Status: AC
  Filled 2015-07-18: qty 1

## 2015-07-18 MED ORDER — PROMETHAZINE HCL 25 MG/ML IJ SOLN: 6.2500 mg | INTRAMUSCULAR | Status: DC | PRN

## 2015-07-18 MED ORDER — FENTANYL CITRATE (PF) 250 MCG/5ML IJ SOLN
INTRAMUSCULAR | Status: AC
  Filled 2015-07-18: qty 5

## 2015-07-18 MED ORDER — EPHEDRINE SULFATE 50 MG/ML IJ SOLN
INTRAMUSCULAR | Status: AC
  Filled 2015-07-18: qty 1

## 2015-07-18 MED ORDER — BUPIVACAINE-EPINEPHRINE (PF) 0.25% -1:200000 IJ SOLN
INTRAMUSCULAR | Status: AC
  Filled 2015-07-18: qty 30

## 2015-07-18 MED ORDER — ONDANSETRON HCL 4 MG/2ML IJ SOLN
4.0000 mg | INTRAMUSCULAR | Status: DC | PRN
  Administered 2015-07-18: 4 mg via INTRAVENOUS

## 2015-07-18 MED ORDER — EPHEDRINE SULFATE 50 MG/ML IJ SOLN
INTRAMUSCULAR | Status: DC | PRN
  Administered 2015-07-18 (×3): 5 mg via INTRAVENOUS

## 2015-07-18 MED ORDER — PROPOFOL 10 MG/ML IV BOLUS
INTRAVENOUS | Status: AC
  Filled 2015-07-18: qty 20

## 2015-07-18 MED ORDER — LACTATED RINGERS IV SOLN
INTRAVENOUS | Status: DC
  Administered 2015-07-18: 07:00:00 via INTRAVENOUS

## 2015-07-18 MED ORDER — PROPOFOL 10 MG/ML IV BOLUS
INTRAVENOUS | Status: DC | PRN
  Administered 2015-07-18: 200 mg via INTRAVENOUS
  Administered 2015-07-18: 120 mg via INTRAVENOUS

## 2015-07-18 MED ORDER — MIDAZOLAM HCL 2 MG/2ML IJ SOLN
INTRAMUSCULAR | Status: AC
  Filled 2015-07-18: qty 4

## 2015-07-18 MED ORDER — GLYCOPYRROLATE 0.2 MG/ML IJ SOLN
INTRAMUSCULAR | Status: AC
  Filled 2015-07-18: qty 2

## 2015-07-18 SURGICAL SUPPLY — 52 items
APL SKNCLS STERI-STRIP NONHPOA (GAUZE/BANDAGES/DRESSINGS) ×1
BENZOIN TINCTURE PRP APPL 2/3 (GAUZE/BANDAGES/DRESSINGS) ×3 IMPLANT
BLADE SURG 15 STRL LF DISP TIS (BLADE) ×1 IMPLANT
BLADE SURG 15 STRL SS (BLADE) ×3
BLADE SURG ROTATE 9660 (MISCELLANEOUS) IMPLANT
CHLORAPREP W/TINT 26ML (MISCELLANEOUS) ×3 IMPLANT
CLOSURE WOUND 1/2 X4 (GAUZE/BANDAGES/DRESSINGS) ×1
COVER SURGICAL LIGHT HANDLE (MISCELLANEOUS) ×3 IMPLANT
DRAIN PENROSE 1/2X12 LTX STRL (WOUND CARE) IMPLANT
DRAPE LAPAROSCOPIC ABDOMINAL (DRAPES) IMPLANT
DRAPE LAPAROTOMY TRNSV 102X78 (DRAPE) IMPLANT
DRAPE UTILITY XL STRL (DRAPES) ×6 IMPLANT
DRSG TEGADERM 4X4.75 (GAUZE/BANDAGES/DRESSINGS) ×3 IMPLANT
ELECT CAUTERY BLADE 6.4 (BLADE) ×3 IMPLANT
ELECT REM PT RETURN 9FT ADLT (ELECTROSURGICAL) ×3
ELECTRODE REM PT RTRN 9FT ADLT (ELECTROSURGICAL) ×1 IMPLANT
GAUZE SPONGE 4X4 12PLY STRL (GAUZE/BANDAGES/DRESSINGS) ×3 IMPLANT
GAUZE SPONGE 4X4 16PLY XRAY LF (GAUZE/BANDAGES/DRESSINGS) ×3 IMPLANT
GLOVE BIO SURGEON STRL SZ7 (GLOVE) ×3 IMPLANT
GLOVE BIOGEL PI IND STRL 6.5 (GLOVE) IMPLANT
GLOVE BIOGEL PI IND STRL 7.0 (GLOVE) IMPLANT
GLOVE BIOGEL PI IND STRL 7.5 (GLOVE) ×1 IMPLANT
GLOVE BIOGEL PI INDICATOR 6.5 (GLOVE) ×2
GLOVE BIOGEL PI INDICATOR 7.0 (GLOVE) ×4
GLOVE BIOGEL PI INDICATOR 7.5 (GLOVE) ×2
GLOVE ECLIPSE 6.5 STRL STRAW (GLOVE) ×4 IMPLANT
GOWN STRL REUS W/ TWL LRG LVL3 (GOWN DISPOSABLE) ×2 IMPLANT
GOWN STRL REUS W/TWL LRG LVL3 (GOWN DISPOSABLE) ×6
KIT BASIN OR (CUSTOM PROCEDURE TRAY) ×3 IMPLANT
KIT ROOM TURNOVER OR (KITS) ×3 IMPLANT
NDL HYPO 25GX1X1/2 BEV (NEEDLE) ×1 IMPLANT
NEEDLE HYPO 25GX1X1/2 BEV (NEEDLE) ×3 IMPLANT
NS IRRIG 1000ML POUR BTL (IV SOLUTION) ×3 IMPLANT
PACK SURGICAL SETUP 50X90 (CUSTOM PROCEDURE TRAY) ×3 IMPLANT
PAD ARMBOARD 7.5X6 YLW CONV (MISCELLANEOUS) ×3 IMPLANT
PENCIL BUTTON HOLSTER BLD 10FT (ELECTRODE) ×3 IMPLANT
SPONGE INTESTINAL PEANUT (DISPOSABLE) ×3 IMPLANT
STRIP CLOSURE SKIN 1/2X4 (GAUZE/BANDAGES/DRESSINGS) ×2 IMPLANT
SUT MNCRL AB 4-0 PS2 18 (SUTURE) ×3 IMPLANT
SUT PDS AB 0 CT 36 (SUTURE) IMPLANT
SUT PROLENE 2 0 CT2 30 (SUTURE) ×2 IMPLANT
SUT SILK 2 0 SH (SUTURE) IMPLANT
SUT SILK 3 0 (SUTURE)
SUT SILK 3-0 18XBRD TIE 12 (SUTURE) IMPLANT
SUT VIC AB 0 CT2 27 (SUTURE) ×3 IMPLANT
SUT VIC AB 2-0 SH 27 (SUTURE) ×3
SUT VIC AB 2-0 SH 27X BRD (SUTURE) ×1 IMPLANT
SUT VIC AB 3-0 SH 27 (SUTURE) ×3
SUT VIC AB 3-0 SH 27XBRD (SUTURE) ×1 IMPLANT
SYR CONTROL 10ML LL (SYRINGE) ×3 IMPLANT
TOWEL OR 17X24 6PK STRL BLUE (TOWEL DISPOSABLE) ×3 IMPLANT
TOWEL OR 17X26 10 PK STRL BLUE (TOWEL DISPOSABLE) ×3 IMPLANT

## 2015-07-18 NOTE — Transfer of Care (Signed)
Immediate Anesthesia Transfer of Care Note  Patient: Benjamin Hogan  Procedure(s) Performed: Procedure(s): OPEN REPAIR OF RECURRENT LEFT INGUINAL HERNIA (Left)  Patient Location: PACU  Anesthesia Type:General  Level of Consciousness: awake, alert  and oriented  Airway & Oxygen Therapy: Patient Spontanous Breathing and Patient connected to nasal cannula oxygen  Post-op Assessment: Report given to RN, Post -op Vital signs reviewed and stable and Patient moving all extremities X 4  Post vital signs: Reviewed and stable  Last Vitals:  Filed Vitals:   07/18/15 0717  BP: 169/99  Pulse: 62  Temp: 36.6 C  Resp: 18    Complications: No apparent anesthesia complications

## 2015-07-18 NOTE — H&P (Signed)
History of Present Illness  The patient is a 49 year old male who presents with an inguinal hernia. Patient referred by Dr. Jackson Latino for evaluation of recurrent left inguinal hernia. 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 In 2007 he underwent open left inguinal hernia repair with mesh by Dr. Marlou Starks. Apparently he had a recurrence and in 2009 he underwent laparoscopic bilateral inguinal hernia repairs with mesh by an Multimedia programmer at [employer]. Over the last year or so he has noticed some tenderness in his left groin. 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 The MRI at that time showed a stress fracture of the left pubic bone with significant edema around the pubic symphysis. It also showed a recurrent hernia on the left. It is unclear whether the hernia occurred at the time of the accident. This area has become more tender and he continues to be able to reduce this area. He denies any obstructive symptoms. He presents now to discuss a another repair. The patient's father as well as his brother have both had multiple hernia repairs and recurrences.   Other Problems  Back Pain High blood pressure Inguinal Hernia Umbilical Hernia Repair  Past Surgical History  Laparoscopic Inguinal Hernia Surgery Bilateral. Open Inguinal Hernia Surgery Left. Oral Surgery Ventral / Umbilical Hernia Surgery Bilateral.  Diagnostic Studies History  Colonoscopy never  Allergies No Known Drug Allergies07/26/2016  Medication History  Lisinopril (40MG  Tablet, Oral) Active. Hydrochlorothiazide (25MG  Tablet, Oral) Active. EpiPen 2-Pak (0.3MG /0.3ML Soln Auto-inj, Injection AS needed) Active. Diclofenac Sodium ER (100MG  Tablet ER 24HR, Oral) Active. Allegra (180MG  Tablet, Oral) Active. Medications  Reconciled  Social History  Alcohol use Occasional alcohol use. Caffeine use Carbonated beverages. No drug use Tobacco use Never smoker.  Family History  Cancer Mother. Diabetes Mellitus Mother. Heart Disease Mother. Hypertension Father, Mother, Sister. Kidney Disease Mother.  Review of Systems  General Not Present- Appetite Loss, Chills, Fatigue, Fever, Night Sweats, Weight Gain and Weight Loss. Skin Not Present- Change in Wart/Mole, Dryness, Hives, Jaundice, New Lesions, Non-Healing Wounds, Rash and Ulcer. HEENT Present- Hearing Loss, Ringing in the Ears, Seasonal Allergies and Wears glasses/contact lenses. Not Present- Earache, Hoarseness, Nose Bleed, Oral Ulcers, Sinus Pain, Sore Throat, Visual Disturbances and Yellow Eyes. Respiratory Present- Snoring. Not Present- Bloody sputum, Chronic Cough, Difficulty Breathing and Wheezing. Cardiovascular Not Present- Chest Pain, Difficulty Breathing Lying Down, Leg Cramps, Palpitations, Rapid Heart Rate, Shortness of Breath and Swelling of Extremities. Gastrointestinal Not Present- Abdominal Pain, Bloating, Bloody Stool, Change in Bowel Habits, Chronic diarrhea, Constipation, Difficulty Swallowing, Excessive gas, Gets full quickly at meals, Hemorrhoids, Indigestion, Nausea, Rectal Pain and Vomiting. Male Genitourinary Not Present- Blood in Urine, Change in Urinary Stream, Frequency, Impotence, Nocturia, Painful Urination, Urgency and Urine Leakage. Musculoskeletal Present- Back Pain and Joint Pain. Not Present- Joint Stiffness, Muscle Pain, Muscle Weakness and Swelling of Extremities. Neurological Not Present- Decreased Memory, Fainting, Headaches, Numbness, Seizures, Tingling, Tremor, Trouble walking and Weakness. Psychiatric Not Present- Anxiety, Bipolar, Change in Sleep Pattern, Depression, Fearful and Frequent crying. Endocrine Not Present- Cold Intolerance, Excessive Hunger, Hair Changes, Heat Intolerance, Hot flashes and New  Diabetes. Hematology Not Present- Easy Bruising, Excessive bleeding, Gland problems, HIV and Persistent Infections.   Vitals   Weight: 321 lb Height: 76in Body Surface Area: 2.79 m Body Mass Index:  39.07 kg/m Temp.: 98.44F(Oral)  Pulse: 74 (Regular)  BP: 138/80 (Sitting, Left Arm, Standard)    Physical Exam The physical exam findings are as follows: Note:WDWN in NAD HEENT: EOMI, sclera anicteric Neck: No masses, no thyromegaly Lungs: CTA bilaterally; normal respiratory effort CV: Regular rate and rhythm; no murmurs Abd: +bowel sounds, soft, non-tender, healed umbilical incision GU: bilateral descended testes; no testicular masses; no sign of right inguinal hernia; reducible left inguinal hernia Ext: Well-perfused; no edema Skin: Warm, dry; no sign of jaundice    Assessment & Plan  RECURRENT LEFT INGUINAL HERNIA (550.91  K40.91) Current Plans  Schedule for Surgery - Open repair of recurrent left inguinal hernia with mesh. The surgical procedure has been discussed with the patient. Potential risks, benefits, alternative treatments, and expected outcomes have been explained. All of the patient's questions at this time have been answered. The likelihood of reaching the patient's treatment goal is good. The patient understand the proposed surgical procedure and wishes to proceed.  Imogene Burn. Georgette Dover, MD, [facility]  General/ Trauma Surgery  07/18/2015 7:15 AM

## 2015-07-18 NOTE — Anesthesia Postprocedure Evaluation (Signed)
  Anesthesia Post-op Note  Patient: Benjamin Hogan  Procedure(s) Performed: Procedure(s): OPEN REPAIR OF RECURRENT LEFT INGUINAL HERNIA (Left)  Patient Location: PACU  Anesthesia Type:General  Level of Consciousness: awake  Airway and Oxygen Therapy: Patient Spontanous Breathing  Post-op Pain: mild  Post-op Assessment: Post-op Vital signs reviewed              Post-op Vital Signs: Reviewed  Last Vitals:  Filed Vitals:   07/18/15 0920  BP: 134/67  Pulse: 71  Temp: 36.6 C  Resp: 15    Complications: No apparent anesthesia complications

## 2015-07-18 NOTE — Progress Notes (Signed)
Mild nausea, "waxes and wanes" per pt/ will give add'l liter NS and emetic

## 2015-07-18 NOTE — Progress Notes (Signed)
Pain at a 3/10 and nausea resolved / will d/c

## 2015-07-18 NOTE — Discharge Instructions (Signed)
[facility] ° ° INGUINAL HERNIA REPAIR: POST OP INSTRUCTIONS ° °Always review your discharge instruction sheet given to you by the facility where your surgery was performed. °IF YOU HAVE DISABILITY OR FAMILY LEAVE FORMS, YOU MUST BRING THEM TO THE OFFICE FOR PROCESSING.   °DO NOT GIVE THEM TO YOUR DOCTOR. ° °1. A  prescription for pain medication may be given to you upon discharge.  Take your pain medication as prescribed, if needed.  If narcotic pain medicine is not needed, then you may take acetaminophen (Tylenol) or ibuprofen (Advil) as needed. °2. Take your usually prescribed medications unless otherwise directed. °3. If you need a refill on your pain medication, please contact your pharmacy.  They will contact our office to request authorization. Prescriptions will not be filled after 5 pm or on week-ends. °4. You should follow a light diet the first 24 hours after arrival home, such as soup and crackers, etc.  Be sure to include lots of fluids daily.  Resume your normal diet the day after surgery. °5. Most patients will experience some swelling and bruising around the umbilicus or in the groin and scrotum.  Ice packs and reclining will help.  Swelling and bruising can take several days to resolve.  °6. It is common to experience some constipation if taking pain medication after surgery.  Increasing fluid intake and taking a stool softener (such as Colace) will usually help or prevent this problem from occurring.  A mild laxative (Milk of Magnesia or Miralax) should be taken according to package directions if there are no bowel movements after 48 hours. °7. Unless discharge instructions indicate otherwise, you may remove your bandages 24-48 hours after surgery, and you may shower at that time.  You will have steri-strips (small skin tapes) in place directly over the incision.  These strips should be left on the skin for 7-10 days. °8. ACTIVITIES:  You may resume regular (light) daily activities  beginning the next day--such as daily self-care, walking, climbing stairs--gradually increasing activities as tolerated.  You may have sexual intercourse when it is comfortable.  Refrain from any heavy lifting or straining until approved by your doctor. °a. You may drive when you are no longer taking prescription pain medication, you can comfortably wear a seatbelt, and you can safely maneuver your car and apply brakes. °b. RETURN TO WORK:  2-3 weeks with light duty - no lifting over 15 lbs. °9. You should see your doctor in the office for a follow-up appointment approximately 2-3 weeks after your surgery.  Make sure that you call for this appointment within a day or two after you arrive home to insure a convenient appointment time. °10. OTHER INSTRUCTIONS:  __________________________________________________________________________________________________________________________________________________________________________________________  °WHEN TO CALL YOUR DOCTOR: °1. Fever over 101.0 °2. Inability to urinate °3. Nausea and/or vomiting °4. Extreme swelling or bruising °5. Continued bleeding from incision. °6. Increased pain, redness, or drainage from the incision ° °The clinic staff is available to answer your questions during regular business hours.  Please don’t hesitate to call and ask to speak to one of the nurses for clinical concerns.  If you have a medical emergency, go to the nearest emergency room or call 911.  A surgeon from [facility] is always on call at the hospital ° ° °[address], [address], [place], [place]  [address] ? ° [address] [address], [place], [place]   [address] °[phone]    [phone]    FAX [phone] °Web site:   [email or web address] ° ° °

## 2015-07-18 NOTE — Op Note (Signed)
Hernia, Open, Procedure Note  Indications: The patient presented with a history of a left, reducible recurrent inguinal hernia.  He had a previous open repair with mesh on 2007 by Dr. Marlou Starks.  In 2009, he had a recurrence and underwent laparoscopic bilateral inguinal hernia repairs with mesh.  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  He presents now for repair.   Pre-operative Diagnosis: left reducible inguinal hernia Post-operative Diagnosis: same  Surgeon: Maia Petties.   Assistants: none  Anesthesia: General endotracheal anesthesia  ASA Class: 2E  Procedure Details  The patient was seen again in the Holding Room. The risks, benefits, complications, treatment options, and expected outcomes were discussed with the patient. The possibilities of reaction to medication, pulmonary aspiration, perforation of viscus, bleeding, recurrent infection, the need for additional procedures, and development of a complication requiring transfusion or further operation were discussed with the patient and/or family. The likelihood of success in repairing the hernia and returning the patient to their previous functional status is good.  There was concurrence with the proposed plan, and informed consent was obtained. The site of surgery was properly noted/marked. The patient was taken to the Operating Room, identified as Benjamin Hogan, and the procedure verified as left inguinal hernia repair. A Time Out was held and the above information confirmed.  The patient was placed in the supine position and underwent induction of anesthesia. The lower abdomen and groin was prepped with Chloraprep and draped in the standard fashion, and 0.25% Marcaine with epinephrine was used to anesthetize the skin over the mid-portion of the inguinal scar. An oblique incision was made through the old scar.  Dissection was carried down through the scar tissue in the subcutaneous tissue with cautery to the external oblique fascia.  We opened the external oblique fascia along the direction of its fibers to the external ring.  We bluntly dissected the fascia away from the underlying Ultrapro mesh.  The spermatic cord was circumferentially dissected bluntly and retracted with a Penrose drain.  The floor of the inguinal canal was inspected and the mesh was in good place and was intact.  We skeletonized the spermatic cord and reduced a large indirect hernia sac.  The internal ring was loose, but the mesh seemed to be in good place.  We tightened the internal ring around the spermatic cord with 2-0 Prolene suture in the tails of the mesh. The external oblique fascia was reapproximated with 2-0 Vicryl.  3-0 Vicryl was used to close the subcutaneous tissues and 4-0 Monocryl was used to close the skin in subcuticular fashion.  Benzoin and steri-strips were used to seal the incision.  A clean dressing was applied.  The patient was then extubated and brought to the recovery room in stable condition.  All sponge, instrument, and needle counts were correct prior to closure and at the conclusion of the case.   Estimated Blood Loss: Minimal                 Complications: None; patient tolerated the procedure well.         Disposition: PACU - hemodynamically stable.         Condition: stable  Imogene Burn. Georgette Dover, MD, [facility]  General/ Trauma Surgery  07/18/2015 9:13 AM

## 2015-07-18 NOTE — Anesthesia Preprocedure Evaluation (Addendum)
Anesthesia Evaluation  Patient identified by MRN, date of birth, ID band Patient awake    Reviewed: Allergy & Precautions, NPO status , Patient's Chart, lab work & pertinent test results, Unable to perform ROS - Chart review only  History of Anesthesia Complications (+) PONV and history of anesthetic complications  Airway Mallampati: II  TM Distance: >3 FB Neck ROM: Full    Dental  (+) Teeth Intact, Dental Advisory Given   Pulmonary shortness of breath and with exertion,  breath sounds clear to auscultation        Cardiovascular hypertension, Pt. on medications Rhythm:Regular Rate:Normal     Neuro/Psych    GI/Hepatic   Endo/Other  Morbid obesity  Renal/GU      Musculoskeletal  (+) Arthritis -,   Abdominal   Peds  Hematology   Anesthesia Other Findings   Reproductive/Obstetrics                            Anesthesia Physical Anesthesia Plan  ASA: II  Anesthesia Plan: General   Post-op Pain Management:    Induction: Intravenous  Airway Management Planned: Oral ETT  Additional Equipment:   Intra-op Plan:   Post-operative Plan: Extubation in OR  Informed Consent: I have reviewed the patients History and Physical, chart, labs and discussed the procedure including the risks, benefits and alternatives for the proposed anesthesia with the patient or authorized representative who has indicated his/her understanding and acceptance.   Dental advisory given  Plan Discussed with: CRNA, Anesthesiologist and Surgeon  Anesthesia Plan Comments:         Anesthesia Quick Evaluation

## 2015-07-18 NOTE — Anesthesia Procedure Notes (Addendum)
Procedure Name: Intubation Date/Time: 07/18/2015 7:47 AM Performed by: Garrison Columbus T Pre-anesthesia Checklist: Patient identified, Emergency Drugs available, Suction available and Patient being monitored Patient Re-evaluated:Patient Re-evaluated prior to inductionOxygen Delivery Method: Circle system utilized Preoxygenation: Pre-oxygenation with 100% oxygen Intubation Type: IV induction Ventilation: Mask ventilation without difficulty and Oral airway inserted - appropriate to patient size Laryngoscope Size: Miller and 3 Grade View: Grade I Tube type: Oral Tube size: 7.5 mm Number of attempts: 1 Airway Equipment and Method: Stylet and Oral airway Placement Confirmation: ETT inserted through vocal cords under direct vision,  positive ETCO2 and breath sounds checked- equal and bilateral Secured at: 24 cm Tube secured with: Tape Dental Injury: Teeth and Oropharynx as per pre-operative assessment  Comments: Intubation by Joylene Igo, SRNA

## 2015-07-19 ENCOUNTER — Encounter (HOSPITAL_COMMUNITY): Payer: Self-pay | Admitting: Surgery

## 2015-11-30 ENCOUNTER — Other Ambulatory Visit: Payer: Self-pay | Admitting: Surgery

## 2015-11-30 ENCOUNTER — Other Ambulatory Visit: Payer: Self-pay

## 2015-11-30 DIAGNOSIS — K4091 Unilateral inguinal hernia, without obstruction or gangrene, recurrent: Secondary | ICD-10-CM

## 2015-12-08 ENCOUNTER — Ambulatory Visit
Admission: RE | Admit: 2015-12-08 | Discharge: 2015-12-08 | Disposition: A | Discharge status: Home / Self Care | Source: Ambulatory Visit | Attending: Surgery | Admitting: Surgery

## 2015-12-08 DIAGNOSIS — K4091 Unilateral inguinal hernia, without obstruction or gangrene, recurrent: Secondary | ICD-10-CM

## 2015-12-08 MED ORDER — IOPAMIDOL (ISOVUE-300) INJECTION 61%
125.0000 mL | Freq: Once | INTRAVENOUS | Status: AC | PRN
  Administered 2015-12-08: 125 mL via INTRAVENOUS

## 2016-01-15 ENCOUNTER — Other Ambulatory Visit: Payer: Self-pay | Admitting: Surgery

## 2016-01-15 NOTE — H&P (Signed)
Benjamin Hogan 01/15/2016 9:30 AM Location: [facility] Patient #: [id number] DOB: 1966/07/26 Married / Language: Cleophus Molt / Race: White Male  History of Present Illness [name]; 01/15/2016 10:05 AM) The patient is a 50 year old male who presents for an evaluation of a hernia. Note for "Hernia": Patient sent for surgical consultation by Dr. Lorna Dibble serial our group. Concern for recurrent LEFT groin bulging and probable hernia.  Pleasant active male. Works in [employer]. Retired but then brought back in. History of very intense physical requirements. Had a LEFT inguinal hernia repair done in 2007 open with mesh. Develop recurrence. Had laparoscopic repair at [facility] 2009. Had episode with significant trauma and LEFT pubic bone fracture in 2015. MRI showed suspicion of recurrent hernia. Develop LEFT groin recurrence. Was repaired in open fashion. Had revision where mesh was re-tacked down 07/18/2015. Had upper respiratory infection with a lot of intense coughing this winter. Developed recurrent discomfort and a LEFT groin recurrent bulge. Exam suspicious. CT scan suspicious for recurrent LEFT groin hernia. They feel it is inguinal. Referral sent to consider laparoscopic removal porch.  Patient does not smoke. He is a large man but not obese. No problems with urination or defecation. No history of MRSA or skin infections. 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 No history of other abdominal surgeries.   Medication History [name], CMA; 01/15/2016 9:33 AM) Losartan Potassium-HCTZ (50-12.5MG  Tablet, Oral) Active. Medications Reconciled    Vitals ([name] CMA; 01/15/2016 9:31 AM) 01/15/2016 9:31 AM Weight: 302 lb Height: 76in Body Surface Area: 2.64 m Body Mass Index: 36.76 kg/m  Pulse: 70 (Regular)  BP: 122/76 (Sitting, Left Arm, Standard)      Physical Exam [name]; 01/15/2016 9:54  AM)  General Mental Status-Alert. General Appearance-Not in acute distress, Not Sickly. Orientation-Oriented X3. Hydration-Well hydrated. Voice-Normal.  Integumentary Global Assessment Upon inspection and palpation of skin surfaces of the - Axillae: non-tender, no inflammation or ulceration, no drainage. and Distribution of scalp and body hair is normal. General Characteristics Temperature - normal warmth is noted.  Head and Neck Head-normocephalic, atraumatic with no lesions or palpable masses. Face Global Assessment - atraumatic, no absence of expression. Neck Global Assessment - no abnormal movements, no bruit auscultated on the right, no bruit auscultated on the left, no decreased range of motion, non-tender. Trachea-midline. Thyroid Gland Characteristics - non-tender.  Eye Eyeball - Left-Extraocular movements intact, No Nystagmus. Eyeball - Right-Extraocular movements intact, No Nystagmus. Cornea - Left-No Hazy. Cornea - Right-No Hazy. Sclera/Conjunctiva - Left-No scleral icterus, No Discharge. Sclera/Conjunctiva - Right-No scleral icterus, No Discharge. Pupil - Left-Direct reaction to light normal. Pupil - Right-Direct reaction to light normal.  ENMT Ears Pinna - Left - no drainage observed, no generalized tenderness observed. Right - no drainage observed, no generalized tenderness observed. Nose and Sinuses External Inspection of the Nose - no destructive lesion observed. Inspection of the nares - Left - quiet respiration. Right - quiet respiration. Mouth and Throat Lips - Upper Lip - no fissures observed, no pallor noted. Lower Lip - no fissures observed, no pallor noted. Nasopharynx - no discharge present. Oral Cavity/Oropharynx - Tongue - no dryness observed. Oral Mucosa - no cyanosis observed. Hypopharynx - no evidence of airway distress observed.  Chest and Lung Exam Inspection Movements - Normal and Symmetrical. Accessory muscles  - No use of accessory muscles in breathing. Palpation Palpation of the chest reveals - Non-tender. Auscultation Breath sounds - Normal  and Clear.  Cardiovascular Auscultation Rhythm - Regular. Murmurs & Other Heart Sounds - Auscultation of the heart reveals - No Murmurs and No Systolic Clicks.  Abdomen Inspection Inspection of the abdomen reveals - No Visible peristalsis and No Abnormal pulsations. Umbilicus - No Bleeding, No Urine drainage. Palpation/Percussion Palpation and Percussion of the abdomen reveal - Soft, Non Tender, No Rebound tenderness, No Rigidity (guarding) and No Cutaneous hyperesthesia. Note: Soft and flat. Mild super umbilical diastases recti. No umbilical hernia. No distention. No pain.  Male Genitourinary Sexual Maturity Tanner 5 - Adult hair pattern and Adult penile size and shape. Note: Normal external male genitalia. Circumcised. Obvious LEFT groin bulge. Is somewhat inferior but does not definitely correlate to the femoral region. The reducible. Groin incisions intact. No pain or discomfort or bulging on the RIGHT side.  Peripheral Vascular Upper Extremity Inspection - Left - No Cyanotic nailbeds, Not Ischemic. Right - No Cyanotic nailbeds, Not Ischemic.  Neurologic Neurologic evaluation reveals -normal attention span and ability to concentrate, able to name objects and repeat phrases. Appropriate fund of knowledge , normal sensation and normal coordination. Mental Status Affect - not angry, not paranoid. Cranial Nerves-Normal Bilaterally. Gait-Normal.  Neuropsychiatric Mental status exam performed with findings of-able to articulate well with normal speech/language, rate, volume and coherence, thought content normal with ability to perform basic computations and apply abstract reasoning and no evidence of hallucinations, delusions, obsessions or homicidal/suicidal ideation.  Musculoskeletal Global Assessment Spine, Ribs and Pelvis - no  instability, subluxation or laxity. Right Upper Extremity - no instability, subluxation or laxity.  Lymphatic Head & Neck  General Head & Neck Lymphatics: Bilateral - Description - No Localized lymphadenopathy. Axillary  General Axillary Region: Bilateral - Description - No Localized lymphadenopathy. Femoral & Inguinal  Generalized Femoral & Inguinal Lymphatics: Left - Description - No Localized lymphadenopathy. Right - Description - No Localized lymphadenopathy.    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 Lower location makes me wonder if it is more femoral but symptoms seem to be scrotal and CT scan radiology read is more suspicious for inguinal hernia.  I recommended laparoscopic possible open exploration repair. Mild use pariah takes instead of ultra Pro if there is significant in contraction. Low threshold affix contralateral side or at least place mesh centrally given his large size and intense physical requirements. If no strong evidence of recurrence on the RIGHT, most likely we'll try and leave alone. We will see. He wishes to proceed.  ENCOUNTER FOR PRE-OP - GROIN HERNIA (Z01.818)  Current Plans You are being scheduled for surgery - Our schedulers will call you.  You should hear from our office's scheduling department within 5 working days about the location, date, and time of surgery. We try to make accommodations for patient's preferences in scheduling surgery, but sometimes the OR schedule or the surgeon's schedule prevents [place] from making those accommodations.  If you have not heard from our office [phone]) in 5 working days, call the office and ask for your surgeon's nurse.  If you have other questions about your diagnosis, plan, or surgery, call the office and ask for your surgeon's nurse.  Pt  Education - Pamphlet Given - Laparoscopic Hernia Repair: discussed with patient and provided information. The anatomy & physiology of the abdominal wall and pelvic floor was discussed. The pathophysiology of hernias in the inguinal and  pelvic region was discussed. Natural history risks such as progressive enlargement, pain, incarceration, and strangulation was discussed. Contributors to complications such as smoking, obesity, diabetes, prior surgery, etc were discussed.  I feel the risks of no intervention will lead to serious problems that outweigh the operative risks; therefore, I recommended surgery to reduce and repair the hernia. I explained laparoscopic techniques with possible need for an open approach. I noted usual use of mesh to patch and/or buttress hernia repair  Risks such as bleeding, infection, abscess, need for further treatment, heart attack, death, and other risks were discussed. I noted a good likelihood this will help address the problem. Goals of post-operative recovery were discussed as well. Possibility that this will not correct all symptoms was explained. I stressed the importance of low-impact activity, aggressive pain control, avoiding constipation, & not pushing through pain to minimize risk of post-operative chronic pain or injury. Possibility of reherniation was discussed. We will work to minimize complications.  An educational handout further explaining the pathology & treatment options was given as well. Questions were answered. The patient expresses understanding & wishes to proceed with surgery.  Pt Education - [facility] Hernia Post-Op HCI (Kirsty Monjaraz): discussed with patient and provided information. Pt Education - [facility] Pain Control (Isamar Wellbrock)  [name], M.D., F.A.C.S. Gastrointestinal and Minimally Invasive Surgery [facility] [address], [place], [place] [address] [phone] Main / Paging

## 2016-02-15 NOTE — Patient Instructions (Signed)
Benjamin Hogan  02/15/2016   Your procedure is scheduled on: March 01, 2016  Report to [facility] Main  Entrance take [place]  elevators to 3rd floor to  [place] at 8:45 AM.  Call this number if you have problems the morning of surgery [phone]   Remember: ONLY 1 PERSON MAY GO WITH YOU TO SHORT STAY TO GET  READY MORNING OF [place].  Do not eat food or drink liquids :After Midnight.     Take these medicines the morning of surgery with A SIP OF WATER: None DO NOT TAKE ANY DIABETIC MEDICATIONS DAY OF YOUR SURGERY                               You may not have any metal on your body including hair pins and              piercings  Do not wear jewelry,, lotions, powders or perfumes, deodorant                       Men may shave face and neck.   Do not bring valuables to the hospital. [place].  Contacts, dentures or bridgework may not be worn into surgery.       Patients discharged the day of surgery will not be allowed to drive home.  Name and phone number of your driver:  Special Instructions: coughing and deep breathing exercises, leg exercises              Please read over the following fact sheets you were given: _____________________________________________________________________             [facility] - Preparing for Surgery Before surgery, you can play an important role.  Because skin is not sterile, your skin needs to be as free of germs as possible.  You can reduce the number of germs on your skin by washing with CHG (chlorahexidine gluconate) soap before surgery.  CHG is an antiseptic cleaner which kills germs and bonds with the skin to continue killing germs even after washing. Please DO NOT use if you have an allergy to CHG or antibacterial soaps.  If your skin becomes reddened/irritated stop using the CHG and inform your nurse when you arrive at Short Stay. Do not shave  (including legs and underarms) for at least 48 hours prior to the first CHG shower.  You may shave your face/neck. Please follow these instructions carefully:  1.  Shower with CHG Soap the night before surgery and the  morning of Surgery.  2.  If you choose to wash your hair, wash your hair first as usual with your  normal  shampoo.  3.  After you shampoo, rinse your hair and body thoroughly to remove the  shampoo.                           4.  Use CHG as you would any other liquid soap.  You can apply chg directly  to the skin and wash                       Gently with a scrungie  or clean washcloth.  5.  Apply the CHG Soap to your body ONLY FROM THE NECK DOWN.   Do not use on face/ open                           Wound or open sores. Avoid contact with eyes, ears mouth and genitals (private parts).                       Wash face,  Genitals (private parts) with your normal soap.             6.  Wash thoroughly, paying special attention to the area where your surgery  will be performed.  7.  Thoroughly rinse your body with warm water from the neck down.  8.  DO NOT shower/wash with your normal soap after using and rinsing off  the CHG Soap.                9.  Pat yourself dry with a clean towel.            10.  Wear clean pajamas.            11.  Place clean sheets on your bed the night of your first shower and do not  sleep with pets. Day of Surgery : Do not apply any lotions/deodorants the morning of surgery.  Please wear clean clothes to the hospital/surgery center.  FAILURE TO FOLLOW THESE INSTRUCTIONS MAY RESULT IN THE CANCELLATION OF YOUR SURGERY PATIENT SIGNATURE_________________________________  NURSE SIGNATURE__________________________________  ________________________________________________________________________

## 2016-02-20 ENCOUNTER — Encounter (HOSPITAL_COMMUNITY)
Admission: RE | Admit: 2016-02-20 | Discharge: 2016-02-20 | Disposition: A | Discharge status: Home / Self Care | Source: Ambulatory Visit | Attending: Surgery | Admitting: Surgery

## 2016-02-20 ENCOUNTER — Encounter (HOSPITAL_COMMUNITY): Payer: Self-pay

## 2016-02-20 DIAGNOSIS — K4090 Unilateral inguinal hernia, without obstruction or gangrene, not specified as recurrent: Secondary | ICD-10-CM | POA: Diagnosis present

## 2016-02-20 DIAGNOSIS — Z01812 Encounter for preprocedural laboratory examination: Secondary | ICD-10-CM | POA: Insufficient documentation

## 2016-02-20 LAB — BASIC METABOLIC PANEL
ANION GAP: 7 (ref 5–15)
BUN: 27 mg/dL — ABNORMAL HIGH (ref 6–20)
CHLORIDE: 110 mmol/L (ref 101–111)
CO2: 28 mmol/L (ref 22–32)
Calcium: 8.9 mg/dL (ref 8.9–10.3)
Creatinine, Ser: 1.06 mg/dL (ref 0.61–1.24)
GFR calc non Af Amer: >60 mL/min (ref >60)
Glucose, Bld: 96 mg/dL (ref 65–99)
Potassium: 4.1 mmol/L (ref 3.5–5.1)
Sodium: 145 mmol/L (ref 135–145)

## 2016-02-20 LAB — CBC
HEMATOCRIT: 38.9 % — AB (ref 39.0–52.0)
HEMOGLOBIN: 13.5 g/dL (ref 13.0–17.0)
MCH: 31.3 pg (ref 26.0–34.0)
MCHC: 34.7 g/dL (ref 30.0–36.0)
MCV: 90.3 fL (ref 78.0–100.0)
PLATELETS: 236 10*3/uL (ref 150–400)
RBC: 4.31 MIL/uL (ref 4.22–5.81)
RDW: 13 % (ref 11.5–15.5)
WBC: 6.3 10*3/uL (ref 4.0–10.5)

## 2016-02-20 NOTE — Progress Notes (Signed)
12-08-15 - CT ABD/[place] - EOIC 07-10-15 - EKG - EPIC 03-27-15 - LOV - C. Dellis Filbert, [place] (fam.med.) - EPIV

## 2016-02-20 NOTE — Progress Notes (Signed)
02-20-16 - BMP lab results from preop visit on 02-20-16 faxed toDr. Johney Maine via [facility]

## 2016-02-29 MED ORDER — DEXTROSE 5 % IV SOLN
3.0000 g | INTRAVENOUS | Status: AC
  Administered 2016-03-01: 3 g via INTRAVENOUS
  Filled 2016-02-29 (×2): qty 3000

## 2016-02-29 NOTE — Progress Notes (Signed)
02-29-16 - Notified pt. That surgery time has been changed to 1345.  Arrival time changed to 11:30.

## 2016-03-01 ENCOUNTER — Encounter (HOSPITAL_COMMUNITY)
Admission: RE | Disposition: A | Discharge status: Home / Self Care | Payer: Self-pay | Source: Ambulatory Visit | Attending: Surgery

## 2016-03-01 ENCOUNTER — Ambulatory Visit (HOSPITAL_COMMUNITY)
Admission: RE | Admit: 2016-03-01 | Discharge: 2016-03-01 | Disposition: A | Discharge status: Home / Self Care | Source: Ambulatory Visit | Attending: Surgery | Admitting: Surgery

## 2016-03-01 ENCOUNTER — Ambulatory Visit (HOSPITAL_COMMUNITY): Admitting: Certified Registered Nurse Anesthetist

## 2016-03-01 ENCOUNTER — Encounter (HOSPITAL_COMMUNITY): Payer: Self-pay | Admitting: *Deleted

## 2016-03-01 DIAGNOSIS — D176 Benign lipomatous neoplasm of spermatic cord: Secondary | ICD-10-CM | POA: Diagnosis not present

## 2016-03-01 DIAGNOSIS — E669 Obesity, unspecified: Secondary | ICD-10-CM | POA: Insufficient documentation

## 2016-03-01 DIAGNOSIS — K4190 Unilateral femoral hernia, without obstruction or gangrene, not specified as recurrent: Secondary | ICD-10-CM | POA: Diagnosis not present

## 2016-03-01 DIAGNOSIS — I10 Essential (primary) hypertension: Secondary | ICD-10-CM | POA: Insufficient documentation

## 2016-03-01 DIAGNOSIS — K4091 Unilateral inguinal hernia, without obstruction or gangrene, recurrent: Secondary | ICD-10-CM | POA: Diagnosis not present

## 2016-03-01 DIAGNOSIS — Z6836 Body mass index (BMI) 36.0-36.9, adult: Secondary | ICD-10-CM | POA: Diagnosis not present

## 2016-03-01 DIAGNOSIS — K4090 Unilateral inguinal hernia, without obstruction or gangrene, not specified as recurrent: Secondary | ICD-10-CM | POA: Diagnosis present

## 2016-03-01 DIAGNOSIS — M1990 Unspecified osteoarthritis, unspecified site: Secondary | ICD-10-CM | POA: Diagnosis not present

## 2016-03-01 HISTORY — PX: INGUINAL HERNIA REPAIR: SHX194

## 2016-03-01 HISTORY — PX: INSERTION OF MESH: SHX5868

## 2016-03-01 SURGERY — REPAIR, HERNIA, INGUINAL, LAPAROSCOPIC
Anesthesia: General | Site: Abdomen | Laterality: Left

## 2016-03-01 MED ORDER — 0.9 % SODIUM CHLORIDE (POUR BTL) OPTIME
TOPICAL | Status: DC | PRN
  Administered 2016-03-01: 1000 mL

## 2016-03-01 MED ORDER — HYDROMORPHONE HCL 1 MG/ML IJ SOLN
0.2500 mg | INTRAMUSCULAR | Status: DC | PRN
  Administered 2016-03-01 (×2): 0.5 mg via INTRAVENOUS

## 2016-03-01 MED ORDER — GLYCOPYRROLATE 0.2 MG/ML IJ SOLN
INTRAMUSCULAR | Status: AC
  Filled 2016-03-01: qty 3

## 2016-03-01 MED ORDER — KETOROLAC TROMETHAMINE 30 MG/ML IJ SOLN
INTRAMUSCULAR | Status: AC
  Filled 2016-03-01: qty 1

## 2016-03-01 MED ORDER — PROCHLORPERAZINE EDISYLATE 5 MG/ML IJ SOLN
INTRAMUSCULAR | Status: AC
  Filled 2016-03-01: qty 2

## 2016-03-01 MED ORDER — LACTATED RINGERS IR SOLN
Status: DC | PRN
  Administered 2016-03-01: 2000 mL

## 2016-03-01 MED ORDER — SUGAMMADEX SODIUM 200 MG/2ML IV SOLN
INTRAVENOUS | Status: DC | PRN
  Administered 2016-03-01: 400 mg via INTRAVENOUS

## 2016-03-01 MED ORDER — BUPIVACAINE-EPINEPHRINE 0.25% -1:200000 IJ SOLN
INTRAMUSCULAR | Status: AC
  Filled 2016-03-01: qty 2

## 2016-03-01 MED ORDER — NEOSTIGMINE METHYLSULFATE 10 MG/10ML IV SOLN
INTRAVENOUS | Status: AC
  Filled 2016-03-01: qty 1

## 2016-03-01 MED ORDER — ONDANSETRON HCL 4 MG/2ML IJ SOLN
INTRAMUSCULAR | Status: DC | PRN
  Administered 2016-03-01: 4 mg via INTRAVENOUS

## 2016-03-01 MED ORDER — HYDROMORPHONE HCL 1 MG/ML IJ SOLN
INTRAMUSCULAR | Status: AC
  Filled 2016-03-01: qty 1

## 2016-03-01 MED ORDER — ROCURONIUM BROMIDE 100 MG/10ML IV SOLN
INTRAVENOUS | Status: DC | PRN
  Administered 2016-03-01: 30 mg via INTRAVENOUS
  Administered 2016-03-01: 50 mg via INTRAVENOUS
  Administered 2016-03-01: 10 mg via INTRAVENOUS
  Administered 2016-03-01: 40 mg via INTRAVENOUS

## 2016-03-01 MED ORDER — LACTATED RINGERS IV SOLN
INTRAVENOUS | Status: DC | PRN
  Administered 2016-03-01: 12:00:00 via INTRAVENOUS

## 2016-03-01 MED ORDER — HYDROCODONE-ACETAMINOPHEN 5-325 MG PO TABS: 1.0000 | ORAL_TABLET | ORAL | Status: DC | PRN

## 2016-03-01 MED ORDER — PROPOFOL 10 MG/ML IV BOLUS
INTRAVENOUS | Status: DC | PRN
  Administered 2016-03-01: 200 mg via INTRAVENOUS

## 2016-03-01 MED ORDER — SUCCINYLCHOLINE CHLORIDE 20 MG/ML IJ SOLN
INTRAMUSCULAR | Status: DC | PRN
  Administered 2016-03-01: 200 mg via INTRAVENOUS

## 2016-03-01 MED ORDER — ONDANSETRON HCL 4 MG/2ML IJ SOLN
INTRAMUSCULAR | Status: AC
  Filled 2016-03-01: qty 2

## 2016-03-01 MED ORDER — KETOROLAC TROMETHAMINE 30 MG/ML IJ SOLN
INTRAMUSCULAR | Status: DC | PRN
  Administered 2016-03-01: 30 mg via INTRAVENOUS

## 2016-03-01 MED ORDER — MIDAZOLAM HCL 2 MG/2ML IJ SOLN
INTRAMUSCULAR | Status: AC
  Filled 2016-03-01: qty 2

## 2016-03-01 MED ORDER — METHOCARBAMOL 750 MG PO TABS: 750.0000 mg | ORAL_TABLET | Freq: Four times a day (QID) | ORAL | Status: DC | PRN

## 2016-03-01 MED ORDER — ROCURONIUM BROMIDE 100 MG/10ML IV SOLN
INTRAVENOUS | Status: AC
  Filled 2016-03-01: qty 1

## 2016-03-01 MED ORDER — SUGAMMADEX SODIUM 500 MG/5ML IV SOLN
INTRAVENOUS | Status: AC
  Filled 2016-03-01: qty 5

## 2016-03-01 MED ORDER — PROCHLORPERAZINE EDISYLATE 5 MG/ML IJ SOLN
10.0000 mg | Freq: Once | INTRAMUSCULAR | Status: AC
  Administered 2016-03-01: 10 mg via INTRAVENOUS

## 2016-03-01 MED ORDER — MIDAZOLAM HCL 5 MG/5ML IJ SOLN
INTRAMUSCULAR | Status: DC | PRN
  Administered 2016-03-01: 2 mg via INTRAVENOUS

## 2016-03-01 MED ORDER — FENTANYL CITRATE (PF) 250 MCG/5ML IJ SOLN
INTRAMUSCULAR | Status: AC
  Filled 2016-03-01: qty 5

## 2016-03-01 MED ORDER — LIDOCAINE HCL (CARDIAC) 20 MG/ML IV SOLN
INTRAVENOUS | Status: DC | PRN
  Administered 2016-03-01: 50 mg via INTRAVENOUS

## 2016-03-01 MED ORDER — FENTANYL CITRATE (PF) 100 MCG/2ML IJ SOLN
INTRAMUSCULAR | Status: DC | PRN
  Administered 2016-03-01: 100 ug via INTRAVENOUS

## 2016-03-01 MED ORDER — BUPIVACAINE-EPINEPHRINE 0.25% -1:200000 IJ SOLN
INTRAMUSCULAR | Status: DC | PRN
  Administered 2016-03-01: 100 mL

## 2016-03-01 MED ORDER — PROPOFOL 10 MG/ML IV BOLUS
INTRAVENOUS | Status: AC
  Filled 2016-03-01: qty 20

## 2016-03-01 MED ORDER — EPHEDRINE SULFATE 50 MG/ML IJ SOLN
INTRAMUSCULAR | Status: DC | PRN
  Administered 2016-03-01 (×3): 10 mg via INTRAVENOUS

## 2016-03-01 MED ORDER — LIDOCAINE HCL (CARDIAC) 20 MG/ML IV SOLN
INTRAVENOUS | Status: AC
  Filled 2016-03-01: qty 5

## 2016-03-01 MED ORDER — BUPIVACAINE-EPINEPHRINE (PF) 0.25% -1:200000 IJ SOLN
INTRAMUSCULAR | Status: AC
  Filled 2016-03-01: qty 30

## 2016-03-01 SURGICAL SUPPLY — 33 items
CABLE HIGH FREQUENCY MONO STRZ (ELECTRODE) ×1 IMPLANT
CHLORAPREP W/TINT 26ML (MISCELLANEOUS) ×4 IMPLANT
COVER SURGICAL LIGHT HANDLE (MISCELLANEOUS) ×2 IMPLANT
DECANTER SPIKE VIAL GLASS SM (MISCELLANEOUS) ×4 IMPLANT
DEVICE SECURE STRAP 25 ABSORB (INSTRUMENTS) IMPLANT
DRAPE LAPAROSCOPIC ABDOMINAL (DRAPES) ×3 IMPLANT
DRAPE WARM FLUID 44X44 (DRAPE) ×3 IMPLANT
DRSG TEGADERM 2-3/8X2-3/4 SM (GAUZE/BANDAGES/DRESSINGS) ×6 IMPLANT
DRSG TEGADERM 4X4.75 (GAUZE/BANDAGES/DRESSINGS) ×3 IMPLANT
ELECT REM PT RETURN 9FT ADLT (ELECTROSURGICAL) ×3
ELECTRODE REM PT RTRN 9FT ADLT (ELECTROSURGICAL) ×2 IMPLANT
GLOVE ECLIPSE 8.0 STRL XLNG CF (GLOVE) ×3 IMPLANT
GLOVE INDICATOR 8.0 STRL GRN (GLOVE) ×3 IMPLANT
GOWN STRL REUS W/TWL XL LVL3 (GOWN DISPOSABLE) ×8 IMPLANT
KIT BASIN OR (CUSTOM PROCEDURE TRAY) ×3 IMPLANT
MESH ULTRAPRO 6X6 15CM15CM (Mesh General) ×3 IMPLANT
PAD POSITIONING PINK XL (MISCELLANEOUS) ×3 IMPLANT
PADDING ION DISPOSABLE ARM LT (MISCELLANEOUS) ×1 IMPLANT
POSITIONER SURGICAL ARM (MISCELLANEOUS) ×1 IMPLANT
SCISSORS LAP 5X35 DISP (ENDOMECHANICALS) ×3 IMPLANT
SET IRRIG TUBING LAPAROSCOPIC (IRRIGATION / IRRIGATOR) IMPLANT
SLEEVE XCEL OPT CAN 5 100 (ENDOMECHANICALS) IMPLANT
SUT MNCRL AB 4-0 PS2 18 (SUTURE) ×3 IMPLANT
SUT VIC AB 3-0 SH 27 (SUTURE) ×3
SUT VIC AB 3-0 SH 27XBRD (SUTURE) IMPLANT
TACKER 5MM HERNIA 3.5CML NAB (ENDOMECHANICALS) IMPLANT
TAPE CLOTH 4X10 WHT NS (GAUZE/BANDAGES/DRESSINGS) ×1 IMPLANT
TOWEL OR 17X26 10 PK STRL BLUE (TOWEL DISPOSABLE) ×3 IMPLANT
TRAY LAPAROSCOPIC (CUSTOM PROCEDURE TRAY) ×3 IMPLANT
TROCAR BLADELESS OPT 5 100 (ENDOMECHANICALS) IMPLANT
TROCAR CANNULA W/PORT DUAL 5MM (MISCELLANEOUS) ×1 IMPLANT
TROCAR XCEL BLUNT TIP 100MML (ENDOMECHANICALS) ×3 IMPLANT
TUBING INSUF HEATED (TUBING) ×3 IMPLANT

## 2016-03-01 NOTE — Interval H&P Note (Signed)
History and Physical Interval Note:  03/01/2016 12:26 PM  Benjamin Hogan  has presented today for surgery, with the diagnosis of Hernias in groins  The various methods of treatment have been discussed with the patient and family. After consideration of risks, benefits and other options for treatment, the patient has consented to  Procedure(s): LAPAROSCOPIC LEFT  INGUINAL HERNIA   LYSIS OF ADHESIONS (Left) INSERTION OF MESH (Left) as a surgical intervention .  The patient's history has been reviewed, patient examined, no change in status, stable for surgery.  I have reviewed the patient's chart and labs.  Questions were answered to the patient's satisfaction.     Alisse Tuite C.

## 2016-03-01 NOTE — Transfer of Care (Signed)
Immediate Anesthesia Transfer of Care Note  Patient: Benjamin Hogan  Procedure(s) Performed: Procedure(s): LAPAROSCOPIC BILATERAL  INGUINAL HERNIA  REPAIRS AND  LYSIS OF ADHESIONS (Left) INSERTION OF MESH (Left)  Patient Location: PACU  Anesthesia Type:General  Level of Consciousness: sedated  Airway & Oxygen Therapy: Patient Spontanous Breathing and Patient connected to face mask oxygen  Post-op Assessment: Report given to RN and Post -op Vital signs reviewed and stable  Post vital signs: Reviewed and stable  Last Vitals:  Filed Vitals:   03/01/16 1146  BP: 128/80  Pulse: 59  Temp: 36.6 C  Resp: 20    Complications: No apparent anesthesia complications

## 2016-03-01 NOTE — Discharge Instructions (Signed)
HERNIA REPAIR: POST OP INSTRUCTIONS ° °1. DIET: Follow a light bland diet the first 24 hours after arrival home, such as soup, liquids, crackers, etc.  Be sure to include lots of fluids daily.  Avoid fast food or heavy meals as your are more likely to get nauseated.  Eat a low fat the next few days after surgery. °2. Take your usually prescribed home medications unless otherwise directed. °3. PAIN CONTROL: °a. Pain is best controlled by a usual combination of three different methods TOGETHER: °i. Ice/Heat °ii. Over the counter pain medication °iii. Prescription pain medication °b. Most patients will experience some swelling and bruising around the hernia(s) such as the bellybutton, groins, or old incisions.  Ice packs or heating pads (30-60 minutes up to 6 times a day) will help. Use ice for the first few days to help decrease swelling and bruising, then switch to heat to help relax tight/sore spots and speed recovery.  Some people prefer to use ice alone, heat alone, alternating between ice & heat.  Experiment to what works for you.  Swelling and bruising can take several weeks to resolve.   °c. It is helpful to take an over-the-counter pain medication regularly for the first few weeks.  Choose one of the following that works best for you: °i. Naproxen (Aleve, etc)  Two 220mg tabs twice a day °ii. Ibuprofen (Advil, etc) Three 200mg tabs four times a day (every meal & bedtime) °iii. Acetaminophen (Tylenol, etc) 325-650mg four times a day (every meal & bedtime) °d. A  prescription for pain medication should be given to you upon discharge.  Take your pain medication as prescribed.  °i. If you are having problems/concerns with the prescription medicine (does not control pain, nausea, vomiting, rash, itching, etc), please call us [phone] to see if we need to switch you to a different pain medicine that will work better for you and/or control your side effect better. °ii. If you need a refill on your pain  medication, please contact your pharmacy.  They will contact our office to request authorization. Prescriptions will not be filled after 5 pm or on week-ends. °4. Avoid getting constipated.  Between the surgery and the pain medications, it is common to experience some constipation.  Increasing fluid intake and taking a fiber supplement (such as Metamucil, Citrucel, FiberCon, MiraLax, etc) 1-2 times a day regularly will usually help prevent this problem from occurring.  A mild laxative (prune juice, Milk of Magnesia, MiraLax, etc) should be taken according to package directions if there are no bowel movements after 48 hours.   °5. Wash / shower every day.  You may shower over the dressings as they are waterproof.   °6. Remove your waterproof bandages 5 days after surgery.  You may leave the incision open to air.  You may replace a dressing/Band-Aid to cover the incision for comfort if you wish.  Continue to shower over incision(s) after the dressing is off. ° ° ° °7. ACTIVITIES as tolerated:   °a. You may resume regular (light) daily activities beginning the next day--such as daily self-care, walking, climbing stairs--gradually increasing activities as tolerated.  If you can walk 30 minutes without difficulty, it is safe to try more intense activity such as jogging, treadmill, bicycling, low-impact aerobics, swimming, etc. °b. Save the most intensive and strenuous activity for last such as sit-ups, heavy lifting, contact sports, etc  Refrain from any heavy lifting or straining until you are off narcotics for pain control.   °  c. DO NOT PUSH THROUGH PAIN.  Let pain be your guide: If it hurts to do something, don't do it.  Pain is your body warning you to avoid that activity for another week until the pain goes down. d. You may drive when you are no longer taking prescription pain medication, you can comfortably wear a seatbelt, and you can safely maneuver your car and apply brakes. e. Dennis Bast may have sexual intercourse  when it is comfortable.  8. FOLLOW UP in our office a. Please call [facility] at [phone] to set up an appointment to see your surgeon in the office for a follow-up appointment approximately 2-3 weeks after your surgery. b. Make sure that you call for this appointment the day you arrive home to insure a convenient appointment time. 9.  IF YOU HAVE DISABILITY OR FAMILY LEAVE FORMS, BRING THEM TO THE OFFICE FOR PROCESSING.  DO NOT GIVE THEM TO YOUR DOCTOR.  WHEN TO CALL us [phone]: 1. Poor pain control 2. Reactions / problems with new medications (rash/itching, nausea, etc)  3. Fever over 101.5 F (38.5 C) 4. Inability to urinate 5. Nausea and/or vomiting 6. Worsening swelling or bruising 7. Continued bleeding from incision. 8. Increased pain, redness, or drainage from the incision   The clinic staff is available to answer your questions during regular business hours (8:30am-5pm).  Please dont hesitate to call and ask to speak to one of our nurses for clinical concerns.   If you have a medical emergency, go to the nearest emergency room or call 911.  A surgeon from [facility] is always on call at the hospitals in [facility], [place], [place], [place], [place]  [address] ?  [address] [address], [place], [place]   [address] MAIN: [phone] ? TOLL FREE: [phone] ? FAX: [phone] [email or web address]  Inguinal Hernia, Adult Muscles help keep everything in the body in its proper place. But if a weak spot in the muscles develops, something can poke through. That is called a hernia. When this happens in the lower part of the belly (abdomen), it is called an inguinal hernia. (It takes its name from a part of the body in this region called the inguinal canal.) A weak spot in the wall of muscles lets some fat or part of the small intestine bulge through. An inguinal hernia can develop at any age. Men get them more often than  women. CAUSES  In adults, an inguinal hernia develops over time.  It can be triggered by:  Suddenly straining the muscles of the lower abdomen.  Lifting heavy objects.  Straining to have a bowel movement. Difficult bowel movements (constipation) can lead to this.  Constant coughing. This may be caused by smoking or lung disease.  Being overweight.  Being pregnant.  Working at a job that requires long periods of standing or heavy lifting.  Having had an inguinal hernia before. One type can be an emergency situation. It is called a strangulated inguinal hernia. It develops if part of the small intestine slips through the weak spot and cannot get back into the abdomen. The blood supply can be cut off. If that happens, part of the intestine may die. This situation requires emergency surgery. SYMPTOMS  Often, a small inguinal hernia has no symptoms. It is found when a healthcare provider does a physical exam. Larger hernias usually have symptoms.   In adults, symptoms may include:  A lump in the groin.  This is easier to see when the person is standing. It might disappear when lying down.  In men, a lump in the scrotum.  Pain or burning in the groin. This occurs especially when lifting, straining or coughing.  A dull ache or feeling of pressure in the groin.  Signs of a strangulated hernia can include:  A bulge in the groin that becomes very painful and tender to the touch.  A bulge that turns red or purple.  Fever, nausea and vomiting.  Inability to have a bowel movement or to pass gas. DIAGNOSIS  To decide if you have an inguinal hernia, a healthcare provider will probably do a physical examination.  This will include asking questions about any symptoms you have noticed.  The healthcare provider might feel the groin area and ask you to cough. If an inguinal hernia is felt, the healthcare provider may try to slide it back into the abdomen.  Usually no other tests are  needed. TREATMENT  Treatments can vary. The size of the hernia makes a difference. Options include:  Watchful waiting. This is often suggested if the hernia is small and you have had no symptoms.  No medical procedure will be done unless symptoms develop.  You will need to watch closely for symptoms. If any occur, contact your healthcare provider right away.  Surgery. This is used if the hernia is larger or you have symptoms.  Open surgery. This is usually an outpatient procedure (you will not stay overnight in a hospital). An cut (incision) is made through the skin in the groin. The hernia is put back inside the abdomen. The weak area in the muscles is then repaired by herniorrhaphy or hernioplasty. Herniorrhaphy: in this type of surgery, the weak muscles are sewn back together. Hernioplasty: a patch or mesh is used to close the weak area in the abdominal wall.  Laparoscopy. In this procedure, a surgeon makes small incisions. A thin tube with a tiny video camera (called a laparoscope) is put into the abdomen. The surgeon repairs the hernia with mesh by looking with the video camera and using two long instruments. HOME CARE INSTRUCTIONS   After surgery to repair an inguinal hernia:  You will need to take pain medicine prescribed by your healthcare provider. Follow all directions carefully.  You will need to take care of the wound from the incision.  Your activity will be restricted for awhile. This will probably include no heavy lifting for several weeks. You also should not do anything too active for a few weeks. When you can return to work will depend on the type of job that you have.  During "watchful waiting" periods, you should:  Maintain a healthy weight.  Eat a diet high in fiber (fruits, vegetables and whole grains).  Drink plenty of fluids to avoid constipation. This means drinking enough water and other liquids to keep your urine clear or pale yellow.  Do not lift heavy  objects.  Do not stand for long periods of time.  Quit smoking. This should keep you from developing a frequent cough. SEEK MEDICAL CARE IF:   A bulge develops in your groin area.  You feel pain, a burning sensation or pressure in the groin. This might be worse if you are lifting or straining.  You develop a fever of more than 100.5 F (38.1 C). SEEK IMMEDIATE MEDICAL CARE IF:   Pain in the groin increases suddenly.  A bulge in the groin gets bigger suddenly and does  not go down.  For men, there is sudden pain in the scrotum. Or, the size of the scrotum increases.  A bulge in the groin area becomes red or purple and is painful to touch.  You have nausea or vomiting that does not go away.  You feel your heart beating much faster than normal.  You cannot have a bowel movement or pass gas.  You develop a fever of more than 102.0 F (38.9 C).   This information is not intended to replace advice given to you by your health care provider. Make sure you discuss any questions you have with your health care provider.   Document Released: 03/16/2009 Document Revised: 01/20/2012 Document Reviewed: 05/01/2015 Elsevier Interactive Patient Education 2016 [employer].  Managing Pain  Pain after surgery or related to activity is often due to strain/injury to muscle, tendon, nerves and/or incisions.  This pain is usually short-term and will improve in a few months.   Many people find it helpful to do the following things TOGETHER to help speed the process of healing and to get back to regular activity more quickly:  1. Avoid heavy physical activity at first a. No lifting greater than 20 pounds at first, then increase to lifting as tolerated over the next few weeks b. Do not push through the pain.  Listen to your body and avoid positions and maneuvers than reproduce the pain.  Wait a few days before trying something more intense c. Walking is okay as tolerated, but go slowly and stop  when getting sore.  If you can walk 30 minutes without stopping or pain, you can try more intense activity (running, jogging, aerobics, cycling, swimming, treadmill, sex, sports, weightlifting, etc ) d. Remember: If it hurts to do it, then dont do it!  2. Take Anti-inflammatory medication a. Choose ONE of the following over-the-counter medications: i.            Acetaminophen 500mg  tabs (Tylenol) 1-2 pills with every meal and just before bedtime (avoid if you have liver problems) ii.            Naproxen 220mg  tabs (ex. Aleve) 1-2 pills twice a day (avoid if you have kidney, stomach, IBD, or bleeding problems) iii. Ibuprofen 200mg  tabs (ex. Advil, Motrin) 3-4 pills with every meal and just before bedtime (avoid if you have kidney, stomach, IBD, or bleeding problems) b. Take with food/snack around the clock for 1-2 weeks i. This helps the muscle and nerve tissues become less irritable and calm down faster  3. Use a Heating pad or Ice/Cold Pack a. 4-6 times a day b. May use warm bath/hottub  or showers  4. Try Gentle Massage and/or Stretching  a. at the area of pain many times a day b. stop if you feel pain - do not overdo it  Try these steps together to help you body heal faster and avoid making things get worse.  Doing just one of these things may not be enough.    If you are not getting better after two weeks or are noticing you are getting worse, contact our office for further advice; we may need to re-evaluate you & see what other things we can do to help.   GETTING TO GOOD BOWEL HEALTH. Irregular bowel habits such as constipation and diarrhea can lead to many problems over time.  Having one soft bowel movement a day is the most important way to prevent further problems.  The anorectal canal is designed to handle  stretching and feces to safely manage our ability to get rid of solid waste (feces, poop, stool) out of our body.  BUT, hard constipated stools can act like ripping concrete  bricks and diarrhea can be a burning fire to this very sensitive area of our body, causing inflamed hemorrhoids, anal fissures, increasing risk is perirectal abscesses, abdominal pain/bloating, an making irritable bowel worse.      The goal: ONE SOFT BOWEL MOVEMENT A DAY!  To have soft, regular bowel movements:   Drink plenty of fluids, consider 4-6 tall glasses of water a day.    Take plenty of fiber.  Fiber is the undigested part of plant food that passes into the colon, acting s natures broom to encourage bowel motility and movement.  Fiber can absorb and hold large amounts of water. This results in a larger, bulkier stool, which is soft and easier to pass. Work gradually over several weeks up to 6 servings a day of fiber (25g a day even more if needed) in the form of: o Vegetables -- Root (potatoes, carrots, turnips), leafy green (lettuce, salad greens, celery, spinach), or cooked high residue (cabbage, broccoli, etc) o Fruit -- Fresh (unpeeled skin & pulp), Dried (prunes, apricots, cherries, etc ),  or stewed ( applesauce)  o Whole grain breads, pasta, etc (whole wheat)  o Bran cereals   Bulking Agents -- This type of water-retaining fiber generally is easily obtained each day by one of the following:  o Psyllium bran -- The psyllium plant is remarkable because its ground seeds can retain so much water. This product is available as Metamucil, Konsyl, Effersyllium, Per Diem Fiber, or the less expensive generic preparation in drug and health food stores. Although labeled a laxative, it really is not a laxative.  o Methylcellulose -- This is another fiber derived from wood which also retains water. It is available as Citrucel. o Polyethylene Glycol - and artificial fiber commonly called Miralax or Glycolax.  It is helpful for people with gassy or bloated feelings with regular fiber o Flax Seed - a less gassy fiber than psyllium  No reading or other relaxing activity while on the toilet. If  bowel movements take longer than 5 minutes, you are too constipated  AVOID CONSTIPATION.  High fiber and water intake usually takes care of this.  Sometimes a laxative is needed to stimulate more frequent bowel movements, but   Laxatives are not a good long-term solution as it can wear the colon out.  They can help jump-start bowels if constipated, but should be relied on constantly without discussing with your doctor o Osmotics (Milk of Magnesia, Fleets phosphosoda, Magnesium citrate, MiraLax, GoLytely) are safer than  o Stimulants (Senokot, Castor Oil, Dulcolax, Ex Lax)    o Avoid taking laxatives for more than 7 days in a row.   IF SEVERELY CONSTIPATED, try a Bowel Retraining Program: o Do not use laxatives.  o Eat a diet high in roughage, such as bran cereals and leafy vegetables.  o Drink six (6) ounces of prune or apricot juice each morning.  o Eat two (2) large servings of stewed fruit each day.  o Take one (1) heaping tablespoon of a psyllium-based bulking agent twice a day. Use sugar-free sweetener when possible to avoid excessive calories.  o Eat a normal breakfast.  o Set aside 15 minutes after breakfast to sit on the toilet, but do not strain to have a bowel movement.  o If you do not have a bowel  movement by the third day, use an enema and repeat the above steps.   Controlling diarrhea o Switch to liquids and simpler foods for a few days to avoid stressing your intestines further. o Avoid dairy products (especially milk & ice cream) for a short time.  The intestines often can lose the ability to digest lactose when stressed. o Avoid foods that cause gassiness or bloating.  Typical foods include beans and other legumes, cabbage, broccoli, and dairy foods.  Every person has some sensitivity to other foods, so listen to our body and avoid those foods that trigger problems for you. o Adding fiber (Citrucel, Metamucil, psyllium, Miralax) gradually can help thicken stools by absorbing  excess fluid and retrain the intestines to act more normally.  Slowly increase the dose over a few weeks.  Too much fiber too soon can backfire and cause cramping & bloating. o Probiotics (such as active yogurt, Align, etc) may help repopulate the intestines and colon with normal bacteria and calm down a sensitive digestive tract.  Most studies show it to be of mild help, though, and such products can be costly. o Medicines: - Bismuth subsalicylate (ex. Kayopectate, Pepto Bismol) every 30 minutes for up to 6 doses can help control diarrhea.  Avoid if pregnant. - Loperamide (Immodium) can slow down diarrhea.  Start with two tablets (4mg  total) first and then try one tablet every 6 hours.  Avoid if you are having fevers or severe pain.  If you are not better or start feeling worse, stop all medicines and call your doctor for advice o Call your doctor if you are getting worse or not better.  Sometimes further testing (cultures, endoscopy, X-ray studies, bloodwork, etc) may be needed to help diagnose and treat the cause of the diarrhea.  TROUBLESHOOTING IRREGULAR BOWELS 1) Avoid extremes of bowel movements (no bad constipation/diarrhea) 2) Miralax 17gm mixed in 8oz. water or juice-daily. May use BID as needed.  3) Gas-x,Phazyme, etc. as needed for gas & bloating.  4) Soft,bland diet. No spicy,greasy,fried foods.  5) Prilosec over-the-counter as needed  6) May hold gluten/wheat products from diet to see if symptoms improve.  7)  May try probiotics (Align, Activa, etc) to help calm the bowels down 7) If symptoms become worse call back immediately.  General Anesthesia, Adult, Care After Refer to this sheet in the next few weeks. These instructions provide you with information on caring for yourself after your procedure. Your health care provider may also give you more specific instructions. Your treatment has been planned according to current medical practices, but problems sometimes occur. Call your  health care provider if you have any problems or questions after your procedure. WHAT TO EXPECT AFTER THE PROCEDURE After the procedure, it is typical to experience:  Sleepiness.  Nausea and vomiting. HOME CARE INSTRUCTIONS  For the first 24 hours after general anesthesia:  Have a responsible person with you.  Do not drive a car. If you are alone, do not take public transportation.  Do not drink alcohol.  Do not take medicine that has not been prescribed by your health care provider.  Do not sign important papers or make important decisions.  You may resume a normal diet and activities as directed by your health care provider.  Change bandages (dressings) as directed.  If you have questions or problems that seem related to general anesthesia, call the hospital and ask for the anesthetist or anesthesiologist on call. SEEK MEDICAL CARE IF:  You have nausea and  vomiting that continue the day after anesthesia.  You develop a rash. SEEK IMMEDIATE MEDICAL CARE IF:   You have difficulty breathing.  You have chest pain.  You have any allergic problems.   This information is not intended to replace advice given to you by your health care provider. Make sure you discuss any questions you have with your health care provider.   Document Released: 02/03/2001 Document Revised: 11/18/2014 Document Reviewed: 02/26/2012 Elsevier Interactive Patient Education [employer].

## 2016-03-01 NOTE — Anesthesia Postprocedure Evaluation (Signed)
Anesthesia Post Note  Patient: Benjamin Hogan  Procedure(s) Performed: Procedure(s) (LRB): LAPAROSCOPIC BILATERAL  INGUINAL HERNIA  REPAIRS AND  LYSIS OF ADHESIONS (Left) INSERTION OF MESH (Left)  Patient location during evaluation: PACU Anesthesia Type: General Level of consciousness: awake and alert Pain management: pain level controlled Vital Signs Assessment: post-procedure vital signs reviewed and stable Respiratory status: spontaneous breathing, nonlabored ventilation, respiratory function stable and patient connected to nasal cannula oxygen Cardiovascular status: blood pressure returned to baseline and stable Postop Assessment: no signs of nausea or vomiting Anesthetic complications: no    Last Vitals:  Filed Vitals:   03/01/16 1626 03/01/16 1715  BP: 135/77 144/81  Pulse: 63 69  Temp: 36.6 C   Resp: 18 18    Last Pain:  Filed Vitals:   03/01/16 1724  PainSc: 1                  Zayn Selley J

## 2016-03-01 NOTE — Anesthesia Procedure Notes (Signed)
Procedure Name: Intubation Date/Time: 03/01/2016 12:45 PM Performed by: Dimas Millin, Ellyce Lafevers F Pre-anesthesia Checklist: Patient identified, Emergency Drugs available, Suction available, Patient being monitored and Timeout performed Patient Re-evaluated:Patient Re-evaluated prior to inductionOxygen Delivery Method: Circle system utilized Preoxygenation: Pre-oxygenation with 100% oxygen Intubation Type: IV induction Ventilation: Mask ventilation without difficulty Laryngoscope Size: Miller and 2 Grade View: Grade II Tube type: Oral Tube size: 7.5 mm Number of attempts: 1 Airway Equipment and Method: Stylet Placement Confirmation: positive ETCO2,  breath sounds checked- equal and bilateral and ETT inserted through vocal cords under direct vision Secured at: 22 cm Tube secured with: Tape Dental Injury: Teeth and Oropharynx as per pre-operative assessment  Comments: Atraumatic intubation. Dentition unchanged.

## 2016-03-01 NOTE — Op Note (Signed)
03/01/2016  3:07 PM  PATIENT:  Benjamin Hogan  50 y.o. male  Patient Care Team: Pcp Not In System as PCP - General Michael Boston, MD as Consulting Physician (General Surgery)  PRE-OPERATIVE DIAGNOSIS:  Hernias in groins  POST-OPERATIVE DIAGNOSIS:    Recurrent left indirect inguinal hernia. Right femoral hernia  PROCEDURE:   LAPAROSCOPIC  LYSIS OF ADHESIONS Laparoscopic repair of recurrent left inguinal hernia with mesh. 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  (not sent)  DISPOSITION OF SPECIMEN:  N/A  COUNTS:  YES  PLAN OF CARE: Discharge to home after PACU  PATIENT DISPOSITION:  PACU - hemodynamically stable.  INDICATION: Pleasant active male.  Works in the [employer]  Had repair of left inguinal hernia.  Had laparoscopic underlay repair of bilateral hernias as well.  Developed severe pelvic fracture on paratrouping incident and developed recurrent hernia.  Re-repair done but developed early recurrence.  Therefore referral made for possible laparoscopic reexploration.  The anatomy & physiology of the abdominal wall and pelvic floor was discussed.  The pathophysiology of hernias in the inguinal and pelvic region was discussed.  Natural history risks such as progressive enlargement, pain, incarceration & strangulation was discussed.   Contributors to complications such as smoking, obesity, diabetes, prior surgery, etc were discussed.    I feel the risks of no intervention will lead to serious problems that outweigh the operative risks; therefore, I recommended surgery to reduce  and repair the hernia.  I explained laparoscopic techniques with possible need for an open approach.  I noted usual use of mesh to patch and/or buttress hernia repair  Risks such as bleeding, infection, abscess, need for further treatment, heart attack, death, and other risks were discussed.  I noted a good likelihood this will help address the problem.   Goals of post-operative recovery were discussed as well.  Possibility that this will not correct all symptoms was explained.  I stressed the importance of low-impact activity, aggressive pain control, avoiding constipation, & not pushing through pain to minimize risk of post-operative chronic pain or injury. Possibility of reherniation was discussed.  We will work to minimize complications.     An educational handout further explaining the pathology & treatment options was given as well.  Questions were answered.  The patient expresses understanding & wishes to proceed with surgery.  OR FINDINGS: Patient had recurrent left inguinal hernia on the internal ring laterally.  Evidence of repair of direct space hernias well.  No evidence of any femoral or obturator hernia.  On the right side some mesh contracture but intact indirect inguinal hernia repair.  Exposed femoral hernia.  No obturator hernia.  No direct space hernia.  Probable lateral migration of right preperitoneal mesh some possible medial migration of the left preperitoneal mesh Ultrapro meshes in the preperitoneal plane.  No evidence of contracture.  Looks like smaller sheets.  DESCRIPTION:   The patient was identified & brought into the operating room. The patient was positioned supine with arms tucked. SCDs were active during the entire case. The patient underwent general anesthesia without any difficulty.  The abdomen was prepped and draped in a sterile fashion. The patient's bladder was emptied.  A Surgical Timeout confirmed our plan.  I made a transverse incision through the inferior  umbilical fold.  I made a small transverse nick through the anterior rectus fascia contralateral to the inguinal hernia side and placed a 0-vicryl stitch through the fascia.  I placed a Hasson trocar into the preperitoneal plane.  Entry was clean.  We induced carbon dioxide insufflation. Camera inspection revealed no injury.  I used a 67mm angled scope to bluntly free the peritoneum off the infraumbilical anterior abdominal wall.  I created enough of a preperitoneal pocket to place 100mm ports into the right & left mid-abdomen into this preperitoneal cavity.  I did Preperitoneal lysis of adhesions to free peritoneum off bilateral lower quadrants.  Encountered some mesh especially more the right preperitoneal space.  Eventually freed this off with sharp dissection, leaving the prior preperitoneal UltraPro mesh to come down with the peritoneum.  I focused attention on the left side since that was the dominant hernia side.   I used mostly sharp dissection to free the peritoneum off the flank and down to the pubic rim.  I freed the anteriolateral bladder wall off the anteriolateral pelvic wall, sparing midline attachments.   I located a swath of peritoneum going into a hernia fascial defect at the lateral aspect of the internal ring consistent with an indirect inguinal hernia.  Large spermatic cord lipoma associated with it.  I gradually freed the peritoneal hernia sac off safely and reduced it into the preperitoneal space.  Isolated the bulky spermatic cord lipoma and skeletonized off the peritoneum.  I transected.  I removed it out the larger 12 mm port gradually.  I freed the peritoneum off the spermatic vessels & vas deferens.  I freed peritoneum off the retroperitoneum along the psoas muscle.    I checked & assured hemostasis.    I turned attention on the opposite side.  Interestingly, the procurement of mesh was more densely adherent on this side.  There was able to free it off.  Looks like the mesh had been  covering the internal ring consistent with a decent internal hernia repair.  However there was an obvious femoral hernia.  No direct space hernia.  I did high ligation of the hernia sac on the right side.  Due to the extensive dissection and adhesions, peritoneal defects were noted.   I used intracorporeal 3-0 Vicryl running suture to close him.  As allowed made also to do a high ligation of the hernia sac on the right side.  Most of the hernia sac and then the spermatic cord lipoma on the left, so I did not do more aggressive ligation on that side  I chose 15x15 cm sheets of ultra-lightweight polypropylene mesh (Ultrapro), one for each side.  I cut a single sigmoid-shaped slit ~6cm from a corner of each mesh.  I placed the meshes into the preperitoneal space & laid them as overlapping diamonds such that at the inferior points, a 6x6 cm corner flap rested in the true anterolateral pelvis, covering the obturator & femoral foramina.   I allowed the bladder to return to the pubis, this helping tuck the corners of the mesh in the anteriolateral pelvis.  The medial corners overlapped each other across midline cephalad to the pubic rim.   This provided >2 inch coverage around the hernia.  Because he was obese, very physically active, and had some diastases; aside  place a third sheet of 15 x 15 cm mesh along the midline as a diamond over the dome of the bladder.  The lateral wings reached to the internal rings as well for good overlap.  This way, the defects were well covered and all pelvic foramina were well covered to prevent any future hernias.  Therefore in the interest of avoiding potential chronic nerve irritation/injury, I did not place any tacks.  I held the hernia sacs cephalad & evacuated carbon dioxide.  I closed the fascia with absorbable suture.  I closed the skin using 4-0 monocryl stitch.  Sterile dressings were applied. The patient was extubated & arrived in the PACU in stable condition..  I had  discussed postoperative care with the patient in the holding area.   I did discuss operative findings and postoperative goals / instructions to the patient's  family as well.  Instructions are written in the chart.  [name], M.D., F.A.C.S. Gastrointestinal and Minimally Invasive Surgery [facility] [address], [place], [place] [address] [phone] Main / Paging

## 2016-03-01 NOTE — Anesthesia Preprocedure Evaluation (Signed)
Anesthesia Evaluation  Patient identified by MRN, date of birth, ID band Patient awake    Reviewed: Allergy & Precautions, NPO status , Patient's Chart, lab work & pertinent test results  History of Anesthesia Complications (+) PONV and history of anesthetic complications  Airway Mallampati: II  TM Distance: >3 FB Neck ROM: Full    Dental no notable dental hx.    Pulmonary shortness of breath, pneumonia,    Pulmonary exam normal breath sounds clear to auscultation       Cardiovascular hypertension, Pt. on medications + DOE  Normal cardiovascular exam Rhythm:Regular Rate:Normal     Neuro/Psych negative neurological ROS  negative psych ROS   GI/Hepatic negative GI ROS, Neg liver ROS,   Endo/Other  negative endocrine ROS  Renal/GU negative Renal ROS  negative genitourinary   Musculoskeletal  (+) Arthritis ,   Abdominal (+) + obese,   Peds negative pediatric ROS (+)  Hematology negative hematology ROS (+)   Anesthesia Other Findings   Reproductive/Obstetrics negative OB ROS                             Anesthesia Physical Anesthesia Plan  ASA: II  Anesthesia Plan: General   Post-op Pain Management:    Induction: Intravenous  Airway Management Planned: Oral ETT  Additional Equipment:   Intra-op Plan:   Post-operative Plan: Extubation in OR  Informed Consent: I have reviewed the patients History and Physical, chart, labs and discussed the procedure including the risks, benefits and alternatives for the proposed anesthesia with the patient or authorized representative who has indicated his/her understanding and acceptance.   Dental advisory given  Plan Discussed with: CRNA  Anesthesia Plan Comments:         Anesthesia Quick Evaluation

## 2016-03-01 NOTE — H&P (Signed)
Benjamin Hogan 01/15/2016 9:30 AM Location: [facility] Patient #: [id number] DOB: [birth year] Married / Language: English / Race: White Male   History of Present Illness  The patient is a 50 year old male who presents for an evaluation of a hernia. Note for "Hernia": Patient sent for surgical consultation by Dr. Lorna Dibble serial our group. Concern for recurrent LEFT groin bulging and probable hernia.  Pleasant active male. Works in [employer]. Retired but then brought back in. History of very intense physical requirements. Had a LEFT inguinal hernia repair done in 2007 open with mesh. Develop recurrence. Had laparoscopic repair at [facility] 2009. Had episode with significant trauma and LEFT pubic bone fracture in 2015. MRI showed suspicion of recurrent hernia. Develop LEFT groin recurrence. Was repaired in open fashion. Had revision where mesh was re-tacked down 07/18/2015. Had upper respiratory infection with a lot of intense coughing this winter. Developed recurrent discomfort and a LEFT groin recurrent bulge. Exam suspicious. CT scan suspicious for recurrent LEFT groin hernia. They feel it is inguinal. Referral sent to consider laparoscopic evaluation and repair. 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  Patient does not smoke. He is a large man but not obese. No problems with urination or defecation. No history of MRSA or skin infections. 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 No history of other abdominal surgeries.  No new events.  Wishes surgery   Medication History [name], CMA; 01/15/2016 9:33 AM) Losartan Potassium-HCTZ (50-12.5MG  Tablet, Oral) Active. Medications Reconciled  Vitals ([name] CMA; 01/15/2016 9:31 AM) 01/15/2016 9:31 AM Weight: 302 lb Height: 76in Body Surface Area: 2.64 m Body Mass Index: 36.76 kg/m  Pulse: 70 (Regular)  BP: 122/76 (Sitting, Left Arm, Standard)       Physical  Exam [name]; 01/15/2016 9:54 AM) General Mental Status-Alert. General Appearance-Not in acute distress, Not Sickly. Orientation-Oriented X3. Hydration-Well hydrated. Voice-Normal.  Integumentary Global Assessment Upon inspection and palpation of skin surfaces of the - Axillae: non-tender, no inflammation or ulceration, no drainage. and Distribution of scalp and body hair is normal. General Characteristics Temperature - normal warmth is noted.  Head and Neck Head-normocephalic, atraumatic with no lesions or palpable masses. Face Global Assessment - atraumatic, no absence of expression. Neck Global Assessment - no abnormal movements, no bruit auscultated on the right, no bruit auscultated on the left, no decreased range of motion, non-tender. Trachea-midline. Thyroid Gland Characteristics - non-tender.  Eye Eyeball - Left-Extraocular movements intact, No Nystagmus. Eyeball - Right-Extraocular movements intact, No Nystagmus. Cornea - Left-No Hazy. Cornea - Right-No Hazy. Sclera/Conjunctiva - Left-No scleral icterus, No Discharge. Sclera/Conjunctiva - Right-No scleral icterus, No Discharge. Pupil - Left-Direct reaction to light normal. Pupil - Right-Direct reaction to light normal.  ENMT Ears Pinna - Left - no drainage observed, no generalized tenderness observed. Right - no drainage observed, no generalized tenderness observed. Nose and Sinuses External Inspection of the Nose - no destructive lesion observed. Inspection of the nares - Left - quiet respiration. Right - quiet respiration. Mouth and Throat Lips - Upper Lip - no fissures observed, no pallor noted. Lower Lip - no fissures observed, no pallor noted. Nasopharynx - no discharge present. Oral Cavity/Oropharynx - Tongue - no dryness observed. Oral Mucosa - no cyanosis observed. Hypopharynx - no evidence of airway distress observed.  Chest and Lung Exam Inspection Movements -  Normal and Symmetrical. Accessory muscles - No use of accessory muscles in breathing. Palpation Palpation of  the chest reveals - Non-tender. Auscultation Breath sounds - Normal and Clear.  Cardiovascular Auscultation Rhythm - Regular. Murmurs & Other Heart Sounds - Auscultation of the heart reveals - No Murmurs and No Systolic Clicks.  Abdomen Inspection Inspection of the abdomen reveals - No Visible peristalsis and No Abnormal pulsations. Umbilicus - No Bleeding, No Urine drainage. Palpation/Percussion Palpation and Percussion of the abdomen reveal - Soft, Non Tender, No Rebound tenderness, No Rigidity (guarding) and No Cutaneous hyperesthesia. Note: Soft and flat. Mild super umbilical diastases recti. No umbilical hernia. No distention. No pain.   Male Genitourinary Sexual Maturity Tanner 5 - Adult hair pattern and Adult penile size and shape. Note: Normal external male genitalia. Circumcised. Obvious LEFT groin bulge. Is somewhat inferior but does not definitely correlate to the femoral region. Reducible. Groin incisions intact. No pain or discomfort or bulging on the RIGHT side.   Peripheral Vascular Upper Extremity Inspection - Left - No Cyanotic nailbeds, Not Ischemic. Right - No Cyanotic nailbeds, Not Ischemic.  Neurologic Neurologic evaluation reveals -normal attention span and ability to concentrate, able to name objects and repeat phrases. Appropriate fund of knowledge , normal sensation and normal coordination. Mental Status Affect - not angry, not paranoid. Cranial Nerves-Normal Bilaterally. Gait-Normal.  Neuropsychiatric Mental status exam performed with findings of-able to articulate well with normal speech/language, rate, volume and coherence, thought content normal with ability to perform basic computations and apply abstract reasoning and no evidence of hallucinations, delusions, obsessions or homicidal/suicidal ideation.  Musculoskeletal Global  Assessment Spine, Ribs and Pelvis - no instability, subluxation or laxity. Right Upper Extremity - no instability, subluxation or laxity.  Lymphatic Head & Neck  General Head & Neck Lymphatics: Bilateral - Description - No Localized lymphadenopathy. Axillary  General Axillary Region: Bilateral - Description - No Localized lymphadenopathy. Femoral & Inguinal  Generalized Femoral & Inguinal Lymphatics: Left - Description - No Localized lymphadenopathy. Right - Description - No Localized lymphadenopathy.    Assessment & Plan  RECURRENT LEFT INGUINAL HERNIA (K40.91)  Impression: Recurrent swelling suspicious for either a new LEFT femoral hernia a recurrent LEFT inguinal hernia. Lower location makes me wonder if it is more femoral but symptoms seem to be scrotal and CT scan radiology read is more suspicious for inguinal hernia.  I recommended laparoscopic possible open exploration repair. Consider Parietex instead of ultra Pro if there is significant in contraction. Low threshold affix contralateral side or at least place mesh centrally given his large size and intense physical requirements. If no strong evidence of recurrence on the RIGHT, most likely we'll try and leave alone. We will see. He wishes to proceed.   ENCOUNTER FOR PRE-OP - GROIN HERNIA (Z01.81 8) Current Plans You are being scheduled for surgery - Our schedulers will call you.  You should hear from our office's scheduling department within 5 working days about the location, date, and time of surgery. We try to make accommodations for patient's preferences in scheduling surgery, but sometimes the OR schedule or the surgeon's schedule prevents [place] from making those accommodations.  If you have not heard from our office [phone]) in 5 working days, call the office and ask for your surgeon's nurse.  If you have other questions about your diagnosis, plan, or surgery, call the office and ask for your surgeon's nurse.  Pt  Education - Pamphlet Given - Laparoscopic Hernia Repair: discussed with patient and provided information. The anatomy & physiology of the abdominal wall and pelvic floor was discussed. The pathophysiology of hernias  in the inguinal and pelvic region was discussed. Natural history risks such as progressive enlargement, pain, incarceration, and strangulation was discussed. Contributors to complications such as smoking, obesity, diabetes, prior surgery, etc were discussed.  I feel the risks of no intervention will lead to serious problems that outweigh the operative risks; therefore, I recommended surgery to reduce and repair the hernia. I explained laparoscopic techniques with possible need for an open approach. I noted usual use of mesh to patch and/or buttress hernia repair  Risks such as bleeding, infection, abscess, need for further treatment, heart attack, death, and other risks were discussed. I noted a good likelihood this will help address the problem. Goals of post-operative recovery were discussed as well. Possibility that this will not correct all symptoms was explained. I stressed the importance of low-impact activity, aggressive pain control, avoiding constipation, & not pushing through pain to minimize risk of post-operative chronic pain or injury. Possibility of reherniation was discussed. We will work to minimize complications.  An educational handout further explaining the pathology & treatment options was given as well. Questions were answered. The patient expresses understanding & wishes to proceed with surgery.  Pt Education - [facility] Hernia Post-Op HCI (Erico Stan): discussed with patient and provided information. Pt Education - [facility] Pain Control (Lavilla Delamora)  [name], M.D., F.A.C.S. Gastrointestinal and Minimally Invasive Surgery [facility] [address] [place], [place] [address] [phone] Main / Paging

## 2016-03-04 ENCOUNTER — Encounter (HOSPITAL_COMMUNITY): Payer: Self-pay | Admitting: Surgery

## 2020-12-01 ENCOUNTER — Other Ambulatory Visit: Payer: Self-pay | Admitting: Family Medicine

## 2020-12-01 DIAGNOSIS — I10 Essential (primary) hypertension: Secondary | ICD-10-CM

## 2020-12-12 ENCOUNTER — Ambulatory Visit
Admission: RE | Admit: 2020-12-12 | Discharge: 2020-12-12 | Disposition: A | Discharge status: Home / Self Care | Source: Ambulatory Visit | Attending: Family Medicine | Admitting: Family Medicine

## 2020-12-12 DIAGNOSIS — I10 Essential (primary) hypertension: Secondary | ICD-10-CM

## 2020-12-25 ENCOUNTER — Ambulatory Visit
Admission: RE | Admit: 2020-12-25 | Discharge: 2020-12-25 | Disposition: A | Discharge status: Home / Self Care | Payer: 59 | Source: Ambulatory Visit | Attending: Family Medicine | Admitting: Family Medicine

## 2020-12-25 DIAGNOSIS — I10 Essential (primary) hypertension: Secondary | ICD-10-CM

## 2021-05-25 ENCOUNTER — Telehealth: Payer: Self-pay

## 2021-05-25 NOTE — Telephone Encounter (Signed)
Referral notes sent from [facility], Phone #: [phone], Fax #: [phone]   Notes sent to scheduling

## 2021-07-30 IMAGING — US US RENAL ARTERY STENOSIS
1 series · 13 of 25 positions shown · non-contrast
Comparison: CT abdomen 12/08/2015

CLINICAL DATA: 54-year-old with uncontrolled hypertension.

EXAM:
RENAL/URINARY TRACT ULTRASOUND
RENAL DUPLEX DOPPLER ULTRASOUND

[Series 1: us renal artery stenosis · 0.30mm/px · 13 of 97 slices shown]
[im 1/97]
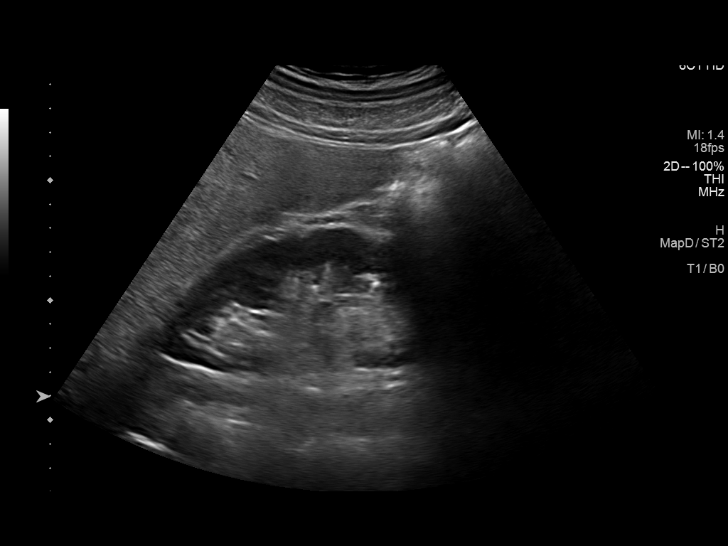
[im 9/97]
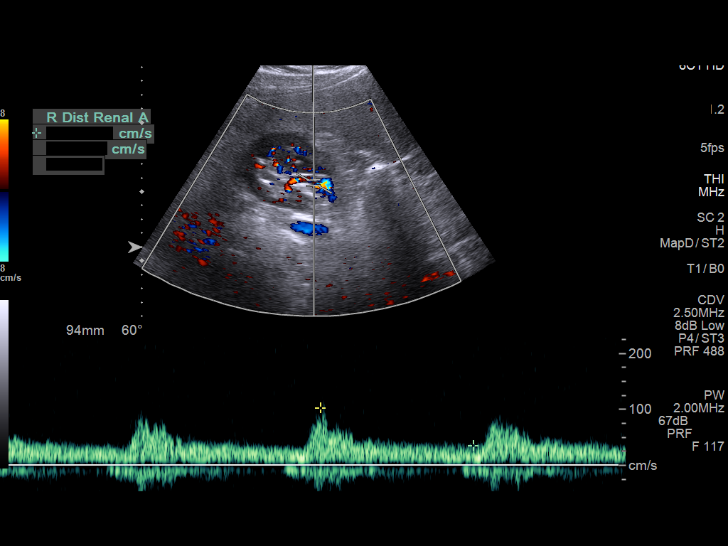
[im 17/97]
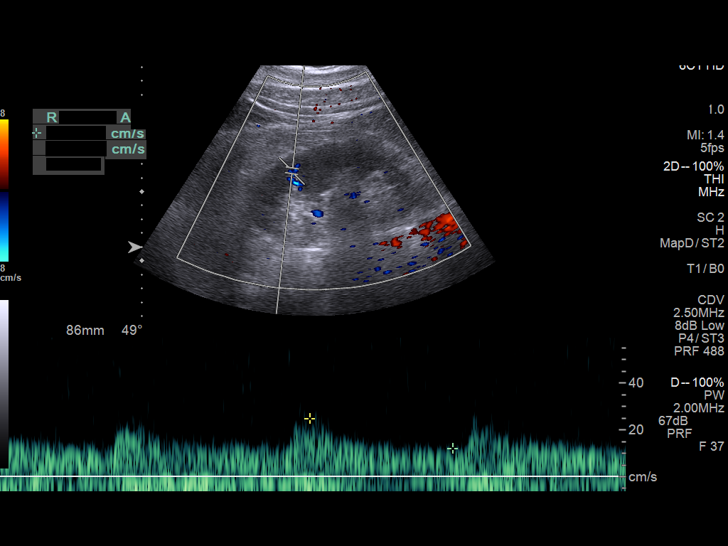
[im 25/97]
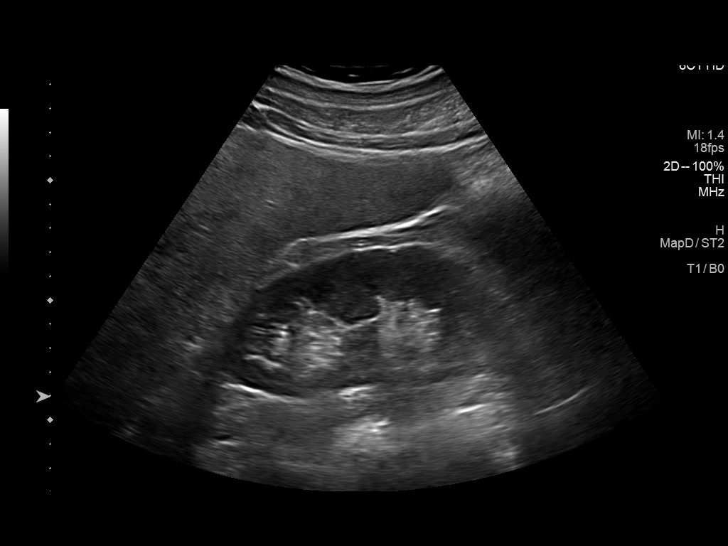
[im 33/97]
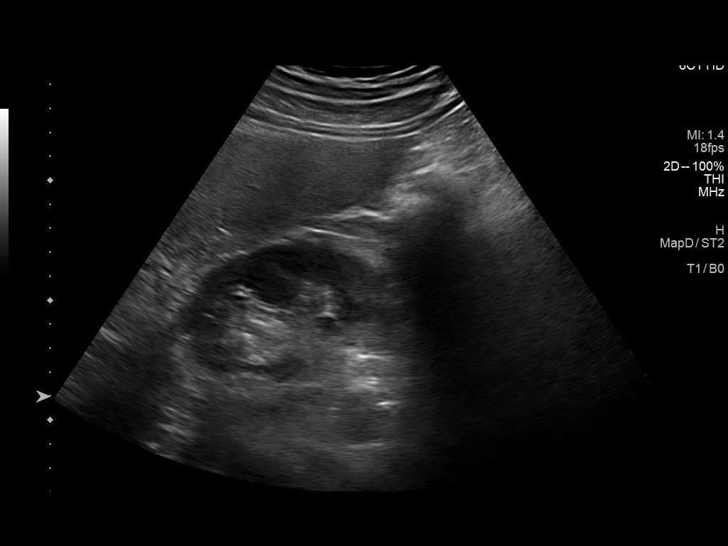
[im 41/97]
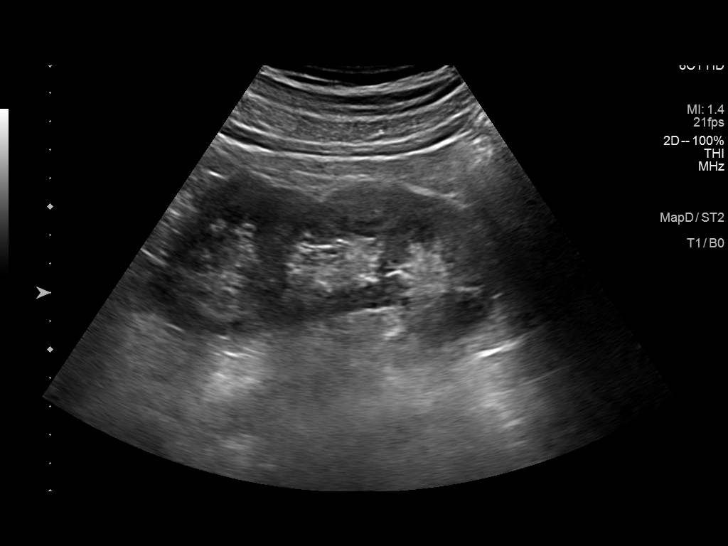
[im 49/97]
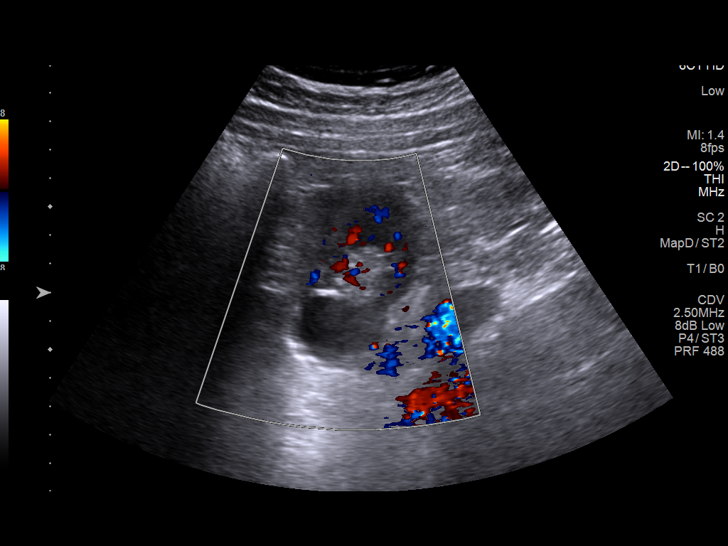
[im 57/97]
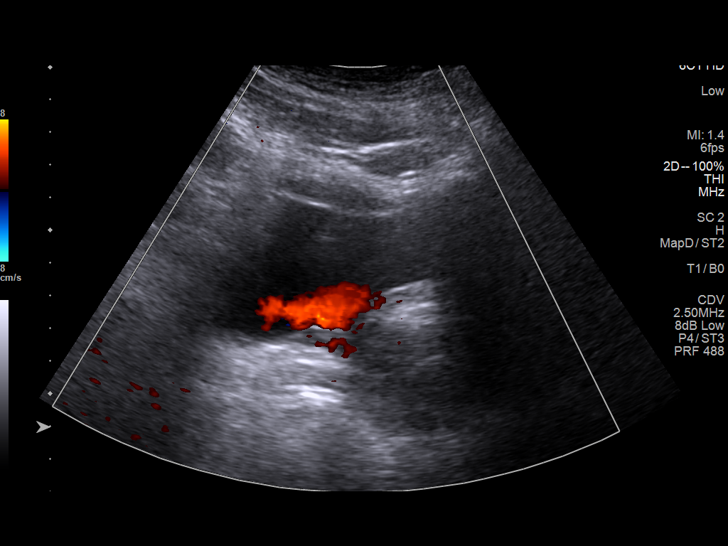
[im 65/97]
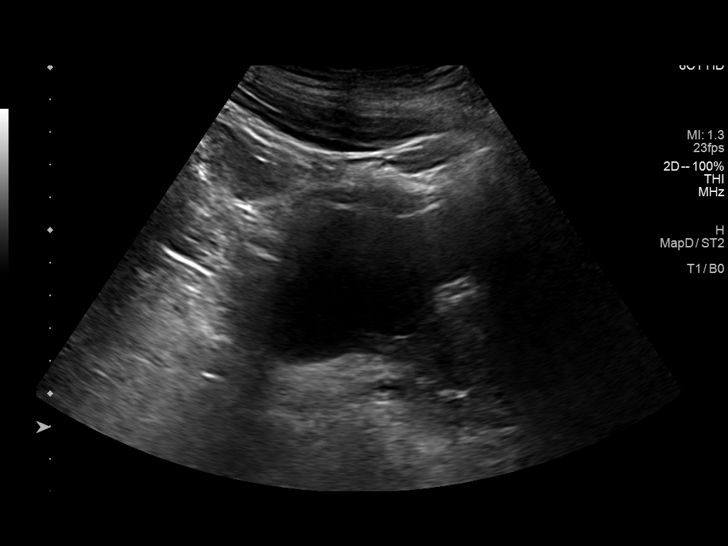
[im 73/97]
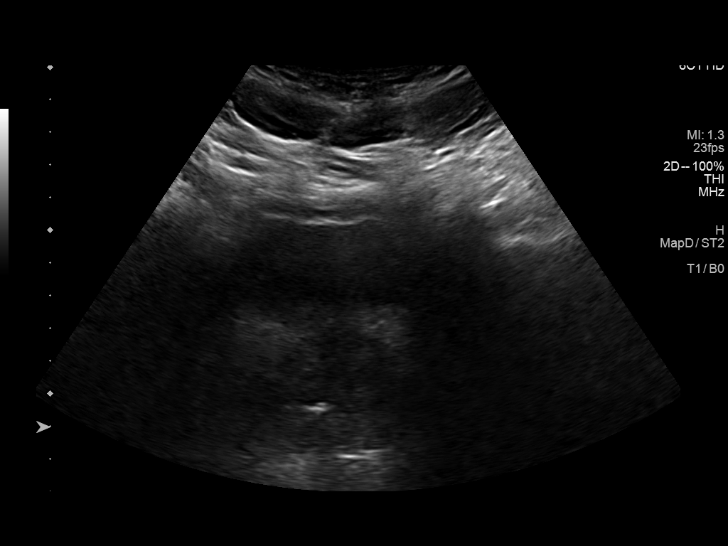
[im 81/97]
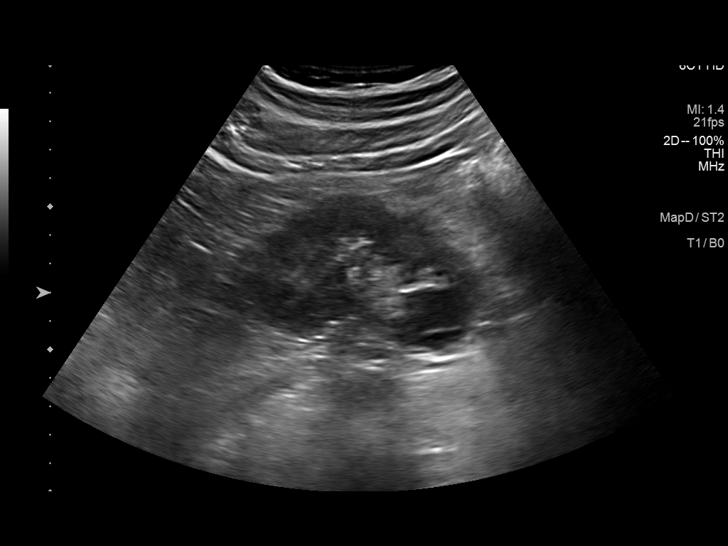
[im 89/97]
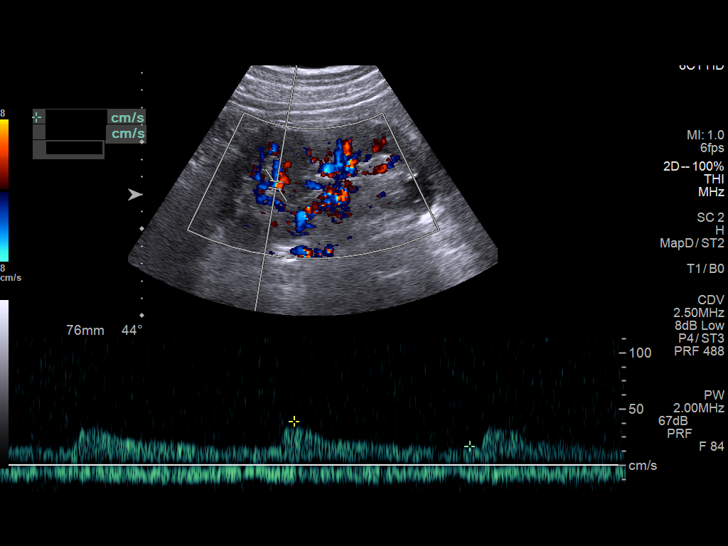
[im 97/97]
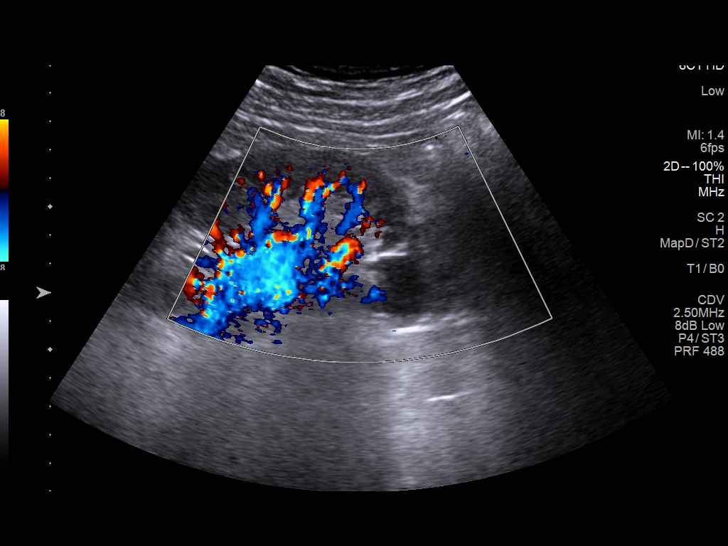

[13 of 25 positions shown; findings below may reference images not displayed]

FINDINGS: Right Kidney:

Size: 12.5 x 6.8 x 7.6 cm, volume 338 mL. Echogenicity within normal
limits. No mass or hydronephrosis visualized.

Left Kidney:

Size: 12.6 x 5.4 x 7.9 cm, volume 281 mL. Echogenicity within normal
limits. No hydronephrosis. There is a complex cystic structure in
the left kidney lower pole with septations. This structure measures
3.9 x 3.4 x 3.3 cm. There is acoustic enhancement associated with
this complex cystic structure. This cystic structure measured
roughly 1.7 cm in 8477.

Bladder:  Normal appearance of the urinary bladder.

RENAL DUPLEX ULTRASOUND

Right Renal Artery Velocities:

Origin:  107 cm/sec

Mid:  106 cm/sec

Hilum:  127 cm/sec

Interlobar:  55 cm/sec

Arcuate:  27 Cm/sec

Left Renal Artery Velocities:

Origin:  54 cm/sec

Mid:  44 cm/sec

Hilum:  55 cm/sec

Interlobar:  34 cm/sec

Arcuate:  22 cm/sec

Aortic Velocity:  154 cm/sec

Right Renal-Aortic Ratios:

Origin:

Mid:

Hilum:

Interlobar:

Arcuate:

Left Renal-Aortic Ratios:

Origin:

Mid:

Hilum:

Interlobar:

Arcuate:

Bilateral renal veins patent.
IMPRESSION: 1. No evidence for renal artery stenosis.
2. **An incidental finding of potential clinical significance has
been found. Complex cyst in the left kidney lower pole measures up
to 3.9 cm and measured 1.7 cm in 8477. This complex cystic structure
is incompletely evaluated on this examination. Recommend further
characterization with a renal MRI, with and without contrast.**

## 2021-09-03 ENCOUNTER — Ambulatory Visit: Payer: Self-pay | Admitting: Interventional Cardiology

## 2021-10-12 ENCOUNTER — Encounter: Payer: Self-pay | Admitting: Cardiovascular Disease

## 2021-10-12 ENCOUNTER — Other Ambulatory Visit: Payer: Self-pay

## 2021-10-12 ENCOUNTER — Ambulatory Visit: Payer: 59 | Admitting: Cardiovascular Disease

## 2021-10-12 VITALS — BP 126/70 | HR 71 | Ht 76.0 in | Wt 302.0 lb

## 2021-10-12 DIAGNOSIS — R011 Cardiac murmur, unspecified: Secondary | ICD-10-CM | POA: Diagnosis not present

## 2021-10-12 NOTE — Progress Notes (Signed)
Chief Complaint  Patient presents with   New Patient (Initial Visit)    HTn    History of Present Illness:55 yo male with history of arthritis, HTN who is here today as a new consult, referred by Dr. Ayesha Rumpf, for the evaluation of HTN. He was seen by Dr. Debara Pickett in 2014 for workup of dyspnea. Exercise nuclear stress test in 2014 without ischemia. He has been on multiple medications now for his HTN. His BP is controlled. He denies chest pain, dyspnea, LE edema, dizziness or near syncope.   Primary Care Physician: Lin Landsman, MD   Past Medical History:  Diagnosis Date   Arthritis    diclofenac   H/O bronchitis    Hx of seasonal allergies    Hypertension    Inguinal hernia    BIL   L1 vertebral fracture (HCC)    Plantar fasciitis    Pneumonia    hx of   PONV (postoperative nausea and vomiting)    Umbilical hernia     Past Surgical History:  Procedure Laterality Date   HERNIA REPAIR     left/right inguinal, and umbilicus   INGUINAL HERNIA REPAIR Left 07/18/2015   Procedure: OPEN REPAIR OF RECURRENT LEFT INGUINAL HERNIA;  Surgeon: Donnie Mesa, MD;  Location: [facility];  Service: General;  Laterality: Left;   INGUINAL HERNIA REPAIR Left 03/01/2016   Procedure: LAPAROSCOPIC BILATERAL  INGUINAL HERNIA  REPAIRS AND  LYSIS OF ADHESIONS;  Surgeon: Michael Boston, MD;  Location: [facility];  Service: General;  Laterality: Left;   INSERTION OF MESH Left 03/01/2016   Procedure: INSERTION OF MESH;  Surgeon: Michael Boston, MD;  Location: [facility];  Service: General;  Laterality: Left;   WISDOM TOOTH EXTRACTION      Current Outpatient Medications  Medication Sig Dispense Refill   anastrozole (ARIMIDEX) 1 MG tablet Take 1 mg by mouth daily.     chlorthalidone (HYGROTON) 50 MG tablet Take 50 mg by mouth daily.     cloNIDine (CATAPRES) 0.3 MG tablet Take 1 tablet by mouth in the morning and at bedtime.     diltiazem (TIAZAC) 120 MG 24 hr capsule Take 1 capsule by mouth daily.     JORNAY PM 100 MG CP24  Take 1 capsule by mouth at bedtime.     levothyroxine (SYNTHROID) 75 MCG tablet Take 1 tablet by mouth daily.     meloxicam (MOBIC) 15 MG tablet Take 15 mg by mouth daily.     olmesartan (BENICAR) 40 MG tablet Take 40 mg by mouth daily.     Omega-3 Fatty Acids (FISH OIL PO) Take 2,000 mg by mouth daily.     Potassium 99 MG TABS Take 1 tablet by mouth daily.     sildenafil (VIAGRA) 100 MG tablet Take 1 tablet by mouth as needed.     No current facility-administered medications for this visit.    No Known Allergies  Social History   Socioeconomic History   Marital status: Married    Spouse name: Kamareon Sciandra   Number of children: 2   Years of education: Not on file   Highest education level: Not on file  Occupational History   Occupation: ICU Nurse    Comment: [employer]  Tobacco Use   Smoking status: Never   Smokeless tobacco: Never  Substance and Sexual Activity   Alcohol use: Yes    Comment: rarely   Drug use: No   Sexual activity: Not on file  Other Topics Concern  Not on file  Social History Narrative   Lives with his wife and their daughters, Raquel Sarna and [place].   Social Determinants of Health   Financial Resource Strain: Not on file  Food Insecurity: Not on file  Transportation Needs: Not on file  Physical Activity: Not on file  Stress: Not on file  Social Connections: Not on file  Intimate Partner Violence: Not on file    Family History  Problem Relation Age of Onset   Atrial fibrillation Mother    Heart failure Mother    Hypertension Mother    Hyperlipidemia Mother    Heart disease Mother        silent MI   Diabetes Mother    Hypertension Father    Hyperlipidemia Father    Hypertension Sister    Hypertension Sister    Hyperlipidemia Brother    Diabetes Maternal Grandmother    Heart disease Maternal Grandmother    Hyperlipidemia Maternal Grandmother    Hypertension Maternal Grandmother    Stroke Maternal Grandmother    Heart disease  Maternal Grandfather    Hypertension Paternal Grandmother    Stroke Paternal Grandfather     Review of Systems:  As stated in the HPI and otherwise negative.   BP 126/70   Pulse 71   Ht 6\' 4"  (1.93 m)   Wt (!) 302 lb (137 kg)   SpO2 97%   BMI 36.76 kg/m   Physical Examination: General: Well developed, well nourished, NAD  HEENT: OP clear, mucus membranes moist  SKIN: warm, dry. No rashes. Neuro: No focal deficits  Musculoskeletal: Muscle strength 5/5 all ext  Psychiatric: Mood and affect normal  Neck: No JVD, no carotid bruits, no thyromegaly, no lymphadenopathy.  Lungs:Clear bilaterally, no wheezes, rhonci, crackles Cardiovascular: Regular rate and rhythm. No murmurs, gallops or rubs. Abdomen:Soft. Bowel sounds present. Non-tender.  Extremities: No lower extremity edema. Pulses are 2 + in the bilateral DP/PT.  EKG:  EKG is ordered today. The ekg ordered today demonstrates sinus.  Recent Labs: No results found for requested labs within last 8760 hours.   Lipid Panel No results found for: CHOL, TRIG, HDL, CHOLHDL, VLDL, LDLCALC, LDLDIRECT   Wt Readings from Last 3 Encounters:  10/12/21 (!) 302 lb (137 kg)  03/01/16 295 lb (133.8 kg)  02/20/16 (!) 305 lb (138.3 kg)      Assessment and Plan:   1. Cardiac murmur: He has a soft systolic murmur. Will arrange an echo to assess  2. HTN: BP controlled. No changes.   Current medicines are reviewed at length with the patient today.  The patient does not have concerns regarding medicines.  The following changes have been made:  no change  Labs/ tests ordered today include:   Orders Placed This Encounter  Procedures   ECHOCARDIOGRAM COMPLETE      Disposition:   F/U with me in two years.    Signed, Lauree Chandler, MD 10/12/2021 5:01 PM    [facility], [place], [place]  [address] Phone: [phone]; Fax: [phone]

## 2021-10-12 NOTE — Patient Instructions (Signed)
Medication Instructions:  NO CHANGES *If you need a refill on your cardiac medications before your next appointment, please call your pharmacy*   Lab Work: NONE  Testing/Procedures: Your physician has requested that you have an echocardiogram. Echocardiography is a painless test that uses sound waves to create images of your heart. It provides your doctor with information about the size and shape of your heart and how well your heart's chambers and valves are working. This procedure takes approximately one hour. There are no restrictions for this procedure.   Follow-Up: At [facility], you and your health needs are our priority.  As part of our continuing mission to provide you with exceptional heart care, we have created designated Provider Care Teams.  These Care Teams include your primary Cardiologist (physician) and Advanced Practice Providers (APPs -  Physician Assistants and Nurse Practitioners) who all work together to provide you with the care you need, when you need it.  We recommend signing up for the patient portal called "MyChart".  Sign up information is provided on this After Visit Summary.  MyChart is used to connect with patients for Virtual Visits (Telemedicine).  Patients are able to view lab/test results, encounter notes, upcoming appointments, etc.  Non-urgent messages can be sent to your provider as well.   To learn more about what you can do with MyChart, go to [email or web address].    Your next appointment:   2 year(s)  The format for your next appointment:   In Person  Provider:   Lauree Chandler, MD     Other Instructions

## 2021-10-16 NOTE — Addendum Note (Signed)
Addended by: Mendel Ryder on: 10/16/2021 01:55 PM   Modules accepted: Orders

## 2021-11-02 ENCOUNTER — Ambulatory Visit (HOSPITAL_COMMUNITY): Payer: 59 | Attending: Cardiology

## 2021-11-02 ENCOUNTER — Other Ambulatory Visit: Payer: Self-pay

## 2021-11-02 DIAGNOSIS — R011 Cardiac murmur, unspecified: Secondary | ICD-10-CM | POA: Diagnosis not present

## 2021-11-02 LAB — ECHOCARDIOGRAM COMPLETE
Area-P 1/2: 2.87 cm2
S' Lateral: 3.3 cm

## 2021-11-13 ENCOUNTER — Encounter (HOSPITAL_COMMUNITY): Payer: Self-pay

## 2021-11-13 ENCOUNTER — Other Ambulatory Visit: Payer: Self-pay

## 2021-11-13 ENCOUNTER — Ambulatory Visit (HOSPITAL_COMMUNITY)
Admission: EM | Admit: 2021-11-13 | Discharge: 2021-11-13 | Disposition: A | Discharge status: Home / Self Care | Payer: 59 | Attending: Urgent Care | Admitting: Urgent Care

## 2021-11-13 DIAGNOSIS — Z23 Encounter for immunization: Secondary | ICD-10-CM

## 2021-11-13 DIAGNOSIS — S61211A Laceration without foreign body of left index finger without damage to nail, initial encounter: Secondary | ICD-10-CM | POA: Diagnosis not present

## 2021-11-13 DIAGNOSIS — S61012A Laceration without foreign body of left thumb without damage to nail, initial encounter: Secondary | ICD-10-CM | POA: Diagnosis not present

## 2021-11-13 MED ORDER — POVIDONE-IODINE 10 % EX SOLN
CUTANEOUS | Status: AC
  Filled 2021-11-13: qty 118

## 2021-11-13 MED ORDER — TETANUS-DIPHTH-ACELL PERTUSSIS 5-2.5-18.5 LF-MCG/0.5 IM SUSY
0.5000 mL | PREFILLED_SYRINGE | Freq: Once | INTRAMUSCULAR | Status: AC
  Administered 2021-11-13: 0.5 mL via INTRAMUSCULAR

## 2021-11-13 MED ORDER — TETANUS-DIPHTH-ACELL PERTUSSIS 5-2.5-18.5 LF-MCG/0.5 IM SUSY
PREFILLED_SYRINGE | INTRAMUSCULAR | Status: AC
  Filled 2021-11-13: qty 0.5

## 2021-11-13 NOTE — ED Provider Notes (Signed)
[facility]    CSN: [id number] Arrival date & time: 11/13/21  1544      History   Chief Complaint Chief Complaint  Patient presents with   Finger Injury    Laceration to right index     HPI Benjamin Hogan is a 56 y.o. male.   Pleasant 56 year old male presents today with concerns of a laceration injury to his right index anger and right thumb.  He states he was working on [employer] and grabbed a saw a wrong.  He states this that was rusty and there were wood chunks on it.  He reports trying to work afterwards, but had to stop as it continued to bleed.  Patient is not up-to-date on his tetanus and last was performed in 2013.  Patient states it is highly uncomfortable, but denies any decreased sensation.  He has full range of motion of his fingers and hands.  He denies any retained foreign bodies.    Past Medical History:  Diagnosis Date   Arthritis    diclofenac   H/O bronchitis    Hx of seasonal allergies    Hypertension    Inguinal hernia    BIL   L1 vertebral fracture (HCC)    Plantar fasciitis    Pneumonia    hx of   PONV (postoperative nausea and vomiting)    Umbilical hernia     Patient Active Problem List   Diagnosis Date Noted   Compression fx, thoracic spine ([place]) 03/27/2015   Plantar fasciitis, bilateral 03/27/2015   DOE (dyspnea on exertion) 09/08/2013   Decreased exercise tolerance 09/08/2013   HTN (hypertension) 09/08/2013   Family history of premature CAD 09/08/2013    Past Surgical History:  Procedure Laterality Date   HERNIA REPAIR     left/right inguinal, and umbilicus   INGUINAL HERNIA REPAIR Left 07/18/2015   Procedure: OPEN REPAIR OF RECURRENT LEFT INGUINAL HERNIA;  Surgeon: Donnie Mesa, MD;  Location: [facility];  Service: General;  Laterality: Left;   INGUINAL HERNIA REPAIR Left 03/01/2016   Procedure: LAPAROSCOPIC BILATERAL  INGUINAL HERNIA  REPAIRS AND  LYSIS OF ADHESIONS;  Surgeon: Michael Boston, MD;  Location:  [facility];  Service: General;  Laterality: Left;   INSERTION OF MESH Left 03/01/2016   Procedure: INSERTION OF MESH;  Surgeon: Michael Boston, MD;  Location: [facility];  Service: General;  Laterality: Left;   WISDOM TOOTH EXTRACTION         Home Medications    Prior to Admission medications   Medication Sig Start Date End Date Taking? Authorizing Provider  anastrozole (ARIMIDEX) 1 MG tablet Take 1 mg by mouth daily.    [provider]  chlorthalidone (HYGROTON) 50 MG tablet Take 50 mg by mouth daily. 10/04/21   [provider]  cloNIDine (CATAPRES) 0.3 MG tablet Take 1 tablet by mouth in the morning and at bedtime. 09/18/20   [provider]  diltiazem (TIAZAC) 120 MG 24 hr capsule Take 1 capsule by mouth daily. 05/23/21   [provider]  JORNAY PM 100 MG CP24 Take 1 capsule by mouth at bedtime. 09/10/21   [provider]  levothyroxine (SYNTHROID) 75 MCG tablet Take 1 tablet by mouth daily. 11/15/20   [provider]  meloxicam (MOBIC) 15 MG tablet Take 15 mg by mouth daily. 08/06/21   [provider]  olmesartan (BENICAR) 40 MG tablet Take 40 mg by mouth daily. 09/13/21   [provider]  Omega-3  Fatty Acids (FISH OIL PO) Take 2,000 mg by mouth daily.    [provider]  Potassium 99 MG TABS Take 1 tablet by mouth daily.    [provider]  sildenafil (VIAGRA) 100 MG tablet Take 1 tablet by mouth as needed. 11/15/20   [provider]    Family History Family History  Problem Relation Age of Onset   Atrial fibrillation Mother    Heart failure Mother    Hypertension Mother    Hyperlipidemia Mother    Heart disease Mother        silent MI   Diabetes Mother    Hypertension Father    Hyperlipidemia Father    Hypertension Sister    Hypertension Sister    Hyperlipidemia Brother    Diabetes Maternal Grandmother    Heart disease Maternal Grandmother    Hyperlipidemia Maternal Grandmother     Hypertension Maternal Grandmother    Stroke Maternal Grandmother    Heart disease Maternal Grandfather    Hypertension Paternal Grandmother    Stroke Paternal Grandfather     Social History Social History   Tobacco Use   Smoking status: Never   Smokeless tobacco: Never  Substance Use Topics   Alcohol use: Yes    Comment: rarely   Drug use: No     Allergies   Amlodipine and Fire ant   Review of Systems Review of Systems  Skin:  Positive for wound (R thumb and R index finger lac). Negative for color change, pallor and rash.       2 individual lacerations to R hand    Physical Exam Triage Vital Signs ED Triage Vitals  Enc Vitals Group     BP 11/13/21 1711 135/84     Pulse Rate 11/13/21 1711 73     Resp 11/13/21 1711 18     Temp 11/13/21 1711 98.4 F (36.9 C)     Temp Source 11/13/21 1711 Oral     SpO2 11/13/21 1711 95 %     Weight --      Height --      Head Circumference --      Peak Flow --      Pain Score 11/13/21 1708 2     Pain Loc --      Pain Edu? --      Excl. in [place]? --    No data found.  Updated Vital Signs BP 135/84 (BP Location: Right Arm)    Pulse 73    Temp 98.4 F (36.9 C) (Oral)    Resp 18    SpO2 95%   Visual Acuity Right Eye Distance:   Left Eye Distance:   Bilateral Distance:    Right Eye Near:   Left Eye Near:    Bilateral Near:     Physical Exam Vitals and nursing note reviewed.  Constitutional:      General: He is not in acute distress.    Appearance: He is well-developed. He is not ill-appearing or toxic-appearing.  HENT:     Head: Normocephalic and atraumatic.  Eyes:     Conjunctiva/sclera: Conjunctivae normal.  Cardiovascular:     Rate and Rhythm: Normal rate and regular rhythm.  Pulmonary:     Effort: Pulmonary effort is normal.     Breath sounds: Normal breath sounds.  Abdominal:     Palpations: Abdomen is soft.  Musculoskeletal:        General: No swelling.     Cervical back: Neck supple.  Skin:    General:  Skin is warm and dry.     Capillary Refill: Capillary refill takes less than 2 seconds.     Coloration: Skin is not jaundiced or pale.     Findings: No erythema.     Comments: 2 separate lacerations to R hand: 1st: roughly 2.5cm to R volar thumb. Neurovascularly intact with no retained FB. FROM thumb. Superficial 2nd: Roughly 4cm to dorsal index finger, full thickness through skin but no fat layer or other finger structures visible. Full sensation and full ROM of finger. Bleed controlled; neurovascularly intact. Small bits of dirt/ wood noted superficially, but easily removed with irrigation.  Neurological:     Mental Status: He is alert.  Psychiatric:        Mood and Affect: Mood normal.     UC Treatments / Results  Labs (all labs ordered are listed, but only abnormal results are displayed) Labs Reviewed - No data to display  EKG   Radiology No results found.  Procedures Laceration Repair  Date/Time: 11/17/2021 12:55 PM Performed by: Chaney Malling, PA Authorized by: Chaney Malling, PA   Consent:    Consent obtained:  Verbal   Consent given by:  Patient   Risks, benefits, and alternatives were discussed: yes     Risks discussed:  Infection, pain, retained foreign body, tendon damage, vascular damage, poor wound healing, poor cosmetic result, need for additional repair and nerve damage   Alternatives discussed:  No treatment, delayed treatment, referral and observation Universal protocol:    Procedure explained and questions answered to patient or proxy's satisfaction: yes     Patient identity confirmed:  Verbally with patient Anesthesia:    Anesthesia method:  Local infiltration   Local anesthetic:  Lidocaine 1% w/o epi Laceration details:    Location:  Finger   Finger location:  R thumb (and R index finger)   Wound length (cm): thumb: 2.5cm; index finger 4cm. Pre-procedure details:    Preparation:  Patient was prepped and draped in usual sterile fashion and  imaging obtained to evaluate for foreign bodies Exploration:    Foreign body noted: superficial dirt removed via saline lavage.     Wound exploration: wound explored through full range of motion and entire depth of wound visualized     Wound extent: no muscle damage noted, no nerve damage noted, no tendon damage noted and no vascular damage noted     Contaminated: yes (extensively cleansed in office)   Treatment:    Area cleansed with:  Povidone-iodine and saline   Amount of cleaning:  Standard   Irrigation solution:  Sterile saline   Irrigation method:  Syringe   Visualized foreign bodies/material removed: yes     Debridement:  None   Undermining:  None Skin repair:    Repair method:  Sutures   Suture size:  4-0   Suture technique:  Simple interrupted   Number of sutures:  6 (6 to index finger, 3 to thumb) Approximation:    Approximation:  Close Repair type:    Repair type:  Intermediate Post-procedure details:    Dressing:  Antibiotic ointment and non-adherent dressing   Procedure completion:  Tolerated (including critical care time)  Medications Ordered in UC Medications  Tdap (BOOSTRIX) injection 0.5 mL (0.5 mLs Intramuscular Given 11/13/21 1819)    Initial Impression / Assessment and Plan / UC Course  I have reviewed the triage vital signs and the nursing notes.  Pertinent labs & imaging results that  were available during my care of the patient were reviewed by me and considered in my medical decision making (see chart for details).     Lacerations repaired in office after meticulous cleansing. Total of 9 sutures placed. Wound care instructions provided. Tdap vaccination updated in office today.  Final Clinical Impressions(s) / UC Diagnoses   Final diagnoses:  Laceration of right index finger without foreign body without damage to nail, initial encounter  Laceration of right thumb without foreign body without damage to nail, initial encounter  Need for prophylactic  vaccination with combined diphtheria-tetanus-pertussis (DTP) vaccine     Discharge Instructions      Keep clean and dry for 48 hours. Wear splint to thumb and index finger to prevent excessive range of motion May apply topical antibiotic ointment, bacitracin or Polysporin preferred. Return to the clinic in 5 to 7 days for suture removal, sooner if any signs of infection occur Your tetanus vaccination was updated today.    ED Prescriptions   None    PDMP not reviewed this encounter.   Chaney Malling, [place] 11/17/21 1314

## 2021-11-13 NOTE — Discharge Instructions (Signed)
Keep clean and dry for 48 hours. Wear splint to thumb and index finger to prevent excessive range of motion May apply topical antibiotic ointment, bacitracin or Polysporin preferred. Return to the clinic in 5 to 7 days for suture removal, sooner if any signs of infection occur Your tetanus vaccination was updated today.

## 2021-11-13 NOTE — ED Triage Notes (Addendum)
Patient presents to Urgent Care with complaints of right index laceration that happened 2.5 hrs ago from a saw blade. Last Tdap 2013 from his records.

## 2022-07-27 ENCOUNTER — Encounter (HOSPITAL_BASED_OUTPATIENT_CLINIC_OR_DEPARTMENT_OTHER): Payer: Self-pay

## 2022-07-27 ENCOUNTER — Other Ambulatory Visit: Payer: Self-pay

## 2022-07-27 ENCOUNTER — Emergency Department (HOSPITAL_BASED_OUTPATIENT_CLINIC_OR_DEPARTMENT_OTHER)
Admission: EM | Admit: 2022-07-27 | Discharge: 2022-07-27 | Disposition: A | Discharge status: Home / Self Care | Payer: 59 | Attending: Emergency Medicine | Admitting: Emergency Medicine

## 2022-07-27 DIAGNOSIS — T1592XA Foreign body on external eye, part unspecified, left eye, initial encounter: Secondary | ICD-10-CM

## 2022-07-27 DIAGNOSIS — X58XXXA Exposure to other specified factors, initial encounter: Secondary | ICD-10-CM | POA: Insufficient documentation

## 2022-07-27 DIAGNOSIS — T1502XA Foreign body in cornea, left eye, initial encounter: Secondary | ICD-10-CM | POA: Diagnosis present

## 2022-07-27 DIAGNOSIS — Y990 Civilian activity done for income or pay: Secondary | ICD-10-CM | POA: Insufficient documentation

## 2022-07-27 MED ORDER — FLUORESCEIN SODIUM 1 MG OP STRP
1.0000 | ORAL_STRIP | Freq: Once | OPHTHALMIC | Status: AC
  Administered 2022-07-27: 1 via OPHTHALMIC
  Filled 2022-07-27: qty 1

## 2022-07-27 MED ORDER — TETRACAINE HCL 0.5 % OP SOLN
2.0000 [drp] | Freq: Once | OPHTHALMIC | Status: AC
  Administered 2022-07-27: 2 [drp] via OPHTHALMIC
  Filled 2022-07-27: qty 4

## 2022-07-27 MED ORDER — OFLOXACIN 0.3 % OP SOLN
2.0000 [drp] | Freq: Four times a day (QID) | OPHTHALMIC | Status: DC
Start: 2022-07-27 — End: 2022-07-27
  Administered 2022-07-27: 2 [drp] via OPHTHALMIC
  Filled 2022-07-27: qty 5

## 2022-07-27 NOTE — ED Triage Notes (Signed)
He states he has had a "piece of metal" in left eye x 2 days. 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 He has a visible f.b. in left eye at 4 o'clock position 25m from pupil center.

## 2022-07-27 NOTE — ED Notes (Signed)
Dc instructions reviewed with patient. Patient voiced understanding. Dc with belongings.  °

## 2022-07-27 NOTE — ED Provider Notes (Signed)
[name] DE[facility] Provider Note   CSN: [id number] Arrival date & time: 07/27/22  4128     History  Chief Complaint  Patient presents with   Eye Problem    Benjamin Hogan is a 56 y.o. male.   Eye Problem    56 year old male with medical history noncontributory who presents to the emergency department with a left eye foreign body.  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  He states that for the past 2 days he has had a metal shaving in his eye.  He has a visible foreign body in the left eye at the 4 o'clock position.  He endorses pain in the vicinity of the foreign body.  Home Medications Prior to Admission medications   Medication Sig Start Date End Date Taking? Authorizing Provider  anastrozole (ARIMIDEX) 1 MG tablet Take 1 mg by mouth daily.    [provider]  chlorthalidone (HYGROTON) 50 MG tablet Take 50 mg by mouth daily. 10/04/21   [provider]  cloNIDine (CATAPRES) 0.3 MG tablet Take 1 tablet by mouth in the morning and at bedtime. 09/18/20   [provider]  diltiazem (TIAZAC) 120 MG 24 hr capsule Take 1 capsule by mouth daily. 05/23/21   [provider]  JORNAY PM 100 MG CP24 Take 1 capsule by mouth at bedtime. 09/10/21   [provider]  levothyroxine (SYNTHROID) 75 MCG tablet Take 1 tablet by mouth daily. 11/15/20   [provider]  meloxicam (MOBIC) 15 MG tablet Take 15 mg by mouth daily. 08/06/21   [provider]  olmesartan (BENICAR) 40 MG tablet Take 40 mg by mouth daily. 09/13/21   [provider]  Omega-3 Fatty Acids (FISH OIL PO) Take 2,000 mg by mouth daily.    [provider]  Potassium 99 MG TABS Take 1 tablet by mouth daily.    [provider]  sildenafil (VIAGRA) 100 MG tablet Take 1 tablet by mouth as needed. 11/15/20   [provider]      Allergies    Amlodipine and Fire ant     Review of Systems   Review of Systems  Eyes:  Positive for pain.  All other systems reviewed and are negative.   Physical Exam Updated Vital Signs BP 133/88   Pulse 68   Temp 98 F (36.7 C)   Resp 16   SpO2 95%  Physical Exam Vitals and nursing note reviewed.  Constitutional:      General: He is not in acute distress. HENT:     Head: Normocephalic and atraumatic.  Eyes:     Conjunctiva/sclera: Conjunctivae normal.     Pupils: Pupils are equal, round, and reactive to light.     Comments: L eye negative seidel's sign, visible metallic foreign body at the 4 o'clock position 48m from the pupil  Cardiovascular:     Rate and Rhythm: Normal rate and regular rhythm.  Pulmonary:     Effort: Pulmonary effort is normal. No respiratory distress.  Abdominal:     General: There is no distension.     Tenderness: There is no guarding.  Musculoskeletal:        General: No deformity or signs of injury.     Cervical back: Neck supple.  Skin:    Findings: No lesion or rash.  Neurological:     General: No focal deficit present.  Mental Status: He is alert. Mental status is at baseline.     ED Results / Procedures / Treatments   Labs (all labs ordered are listed, but only abnormal results are displayed) Labs Reviewed - No data to display  EKG None  Radiology No results found.  Procedures .Foreign Body Removal  Date/Time: 07/27/2022 9:09 AM  Performed by: Regan Lemming, MD Authorized by: Regan Lemming, MD  Consent: Verbal consent obtained. Risks and benefits: risks, benefits and alternatives were discussed Consent given by: patient Required items: required blood products, implants, devices, and special equipment available Patient identity confirmed: verbally with patient Body area: eye Location details: left cornea  Anesthesia: Local Anesthetic: tetracaine drops Localization method: visualized Removal mechanism: moist cotton swab Eye examined with  fluorescein. Fluorescein uptake. Corneal abrasion size: small Corneal abrasion location: medial Depth: embedded Complexity: complex 1 objects recovered. Objects recovered: metalic object Post-procedure assessment: residual foreign bodies remain Patient tolerance: patient tolerated the procedure well with no immediate complications      Medications Ordered in ED Medications  tetracaine (PONTOCAINE) 0.5 % ophthalmic solution 2 drop (2 drops Left Eye Given by Other 07/27/22 0904)  fluorescein ophthalmic strip 1 strip (1 strip Left Eye Given by Other 07/27/22 3546)    ED Course/ Medical Decision Making/ A&P                           Medical Decision Making Risk Prescription drug management.    56 year old male with medical history noncontributory who presents to the emergency department with a left eye foreign body.  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  He states that for the past 2 days he has had a metal shaving in his eye.  He has a visible foreign body in the left eye at the 4 o'clock position.  He endorses pain in the vicinity of the foreign body. His tdap is up to date.  On arrival, the patient was vitally stable.  Presenting with metallic foreign body in the eye for the past 2 days.  Physical Exam: L eye negative seidel's sign, visible metallic foreign body at the 4 o'clock position 24m from the pupil  A piece of the foreign body was removed from the eye, however, residual metal remains fairly embedded in the cornea. Ophthalmology consult placed. Discussed with Dr. MEllie Lunch who recommended ABX drops and will see the patient in clinic today at 2pm.  Patient updated on plan of care.  Stable for outpatient management.     Final Clinical Impression(s) / ED Diagnoses Final diagnoses:  Foreign body of left eye, initial encounter    Rx / DC Orders ED Discharge Orders     None         LRegan Lemming MD 07/27/22  2258

## 2022-07-27 NOTE — Discharge Instructions (Addendum)
Please follow-up with Dr. Ellie Lunch in her office TODAY at 2pm. You have been given antibiotic eye drops. Take 2 drops 4 times daily for the next 5 days.

## 2023-10-08 NOTE — Progress Notes (Signed)
Cardiology Office Note:  .   Date:  10/21/2023  ID:  Benjamin Hogan, DOB 1966-03-09, MRN [id number] PCP: Benjamin Able, MD  [facility] Providers Cardiologist:  Benjamin Carrow, MD    History of Present Illness: .   Benjamin Hogan is a 57 y.o. male with history of arthritis, HTN, OSA on CPAP, 2014 for workup of dyspnea. Exercise nuclear stress test in 2013-11-15 without ischemia. MGF died MI 24, mother MI 34, CHF died 3.   He saw Dr. Clifton Hogan 15-Nov-2021 for cardiac murmur.Echo no significant valvular disease, normal LVEF.  Patient comes in for regular f/u. 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 He is not a morning person and when he walks into work he complains of tightness only effects him in the am. No other time of day. Clears in a minute. Takes his meds at night and worse since diltiazem increased to 180 mg daily for HTN. No regular exercise outside of work. On his feet all day.BP at home 120/80-90, higher today. Had labs at [facility] in Sept chol 210, HDL 38, LDL over 100, K low. Family history of hyperaldosteronism. BP up today. Stopped taking am clonidine because of severe dry mouth. Some extra salt, some extra caffeine.   ROS:    Studies Reviewed: Marland Kitchen      Prior CV Studies:   Echo Nov 15, 2021 IMPRESSIONS     1. Left ventricular ejection fraction, by estimation, is 55 to 60%. The  left ventricle has normal function. The left ventricle has no regional  wall motion abnormalities. There is mild concentric left ventricular  hypertrophy. Left ventricular diastolic  parameters were normal. The average left ventricular global longitudinal  strain is -22.4 %. The global longitudinal strain is normal.   2. Right ventricular systolic function is normal. The right ventricular  size is normal.   3. Left atrial size was mildly dilated.   4. The mitral valve is grossly normal. Trivial mitral valve  regurgitation. No evidence of mitral stenosis.   5. The aortic valve  is grossly normal. Aortic valve regurgitation is not  visualized. No aortic stenosis is present.   6. Aortic dilatation noted. There is borderline dilatation of the  ascending aorta, measuring 38 mm.   7. The inferior vena cava is normal in size with greater than 50%  respiratory variability, suggesting right atrial pressure of 3 mmHg.   Comparison(s): No prior Echocardiogram.   Conclusion(s)/Recommendation(s): Otherwise normal echocardiogram, with  minor abnormalities described in the report.    Risk Assessment/Calculations:            Physical Exam:   VS:  BP (!) 140/98   Pulse 61   Ht 6\' 4"  (1.93 m)   Wt (!) 331 lb 3.2 oz (150.2 kg)   SpO2 95%   BMI 40.31 kg/m    Wt Readings from Last 3 Encounters:  10/21/23 (!) 331 lb 3.2 oz (150.2 kg)  10/12/21 (!) 302 lb (137 kg)  03/01/16 295 lb (133.8 kg)    GEN: Obese, in no acute distress NECK: No JVD; No carotid bruits CARDIAC:  RRR, no murmurs, rubs, gallops RESPIRATORY:  Clear to auscultation without rales, wheezing or rhonchi  ABDOMEN: Soft, non-tender, non-distended EXTREMITIES:  No edema; No deformity   ASSESSMENT AND PLAN: .    HTN BP uncontrolled, not happy taking 4 meds and not tolerating am clonidine-dry mouth, dizziness, many current stressors with working full time/NP school/stress eating -add hydralazine  25 mg bid 2 gm sodium diet -refer to HTN clinic -bmet today  chest pain, normal NST 2014, now with chest tightness walking into work in am. Coronary CTA-hold diltiazem day of CTA and take metoprolol 100 mg prior to study.  Obesity-exercise and weight loss discussed(has lost 10 lbs recently)  OSA on CPAP  HLD LDL over 100 at [place] not on statin-await CTA results  Family history of early CAD   [id number]}   Dispo: f/u in HTN clinic  Signed, Benjamin Reedy, PA-C

## 2023-10-21 ENCOUNTER — Ambulatory Visit: Payer: 59 | Attending: Physician Assistant | Admitting: Physician Assistant

## 2023-10-21 ENCOUNTER — Encounter: Payer: Self-pay | Admitting: Physician Assistant

## 2023-10-21 VITALS — BP 140/98 | HR 61 | Ht 76.0 in | Wt 331.2 lb

## 2023-10-21 DIAGNOSIS — I10 Essential (primary) hypertension: Secondary | ICD-10-CM | POA: Diagnosis not present

## 2023-10-21 DIAGNOSIS — Z8249 Family history of ischemic heart disease and other diseases of the circulatory system: Secondary | ICD-10-CM

## 2023-10-21 DIAGNOSIS — R079 Chest pain, unspecified: Secondary | ICD-10-CM | POA: Diagnosis not present

## 2023-10-21 DIAGNOSIS — G4733 Obstructive sleep apnea (adult) (pediatric): Secondary | ICD-10-CM | POA: Diagnosis not present

## 2023-10-21 DIAGNOSIS — E7849 Other hyperlipidemia: Secondary | ICD-10-CM

## 2023-10-21 MED ORDER — METOPROLOL TARTRATE 100 MG PO TABS: 100.0000 mg | ORAL_TABLET | Freq: Once | ORAL | 0 refills | Status: DC

## 2023-10-21 MED ORDER — HYDRALAZINE HCL 25 MG PO TABS: 25.0000 mg | ORAL_TABLET | Freq: Two times a day (BID) | ORAL | 2 refills | Status: DC

## 2023-10-21 NOTE — Patient Instructions (Addendum)
Medication Instructions:  Start Hydralazine 25 mg ( Take 1 Tablet Twice Daily). Metoprolol Tartrate 100 mg ( Take 90 minutes- 2 Hours Prior to Scan). *If you need a refill on your cardiac medications before your next appointment, please call your pharmacy*   Lab Work: BMET If you have labs (blood work) drawn today and your tests are completely normal, you will receive your results only by: MyChart Message (if you have MyChart) OR A paper copy in the mail If you have any lab test that is abnormal or we need to change your treatment, we will call you to review the results.   Testing/Procedures:   Your cardiac CT will be scheduled at one of the below locations:   [facility] [address] [place], [place] [address] [phone]  If scheduled at [facility], please arrive at the [facility] and Children's Entrance (Entrance C2) of [facility] 30 minutes prior to test start time. You can use the FREE valet parking offered at entrance C (encouraged to control the heart rate for the test)  Proceed to the [facility] Radiology Department (first floor) to check-in and test prep.  All radiology patients and guests should use entrance C2 at [facility], accessed from [facility], even though the hospital's physical address listed is [address].     There is spacious parking and easy access to the radiology department from the [facility] Heart and Vascular entrance. Please enter here and check-in with the desk attendant.   Please follow these instructions carefully (unless otherwise directed):  An IV will be required for this test and Nitroglycerin will be given.  Hold all erectile dysfunction medications at least 3 days (72 hrs) prior to test. (Ie viagra, cialis, sildenafil, tadalafil, etc)   On the Night Before the Test: Be sure to Drink plenty of water. Do not consume any caffeinated/decaffeinated beverages or chocolate 12 hours  prior to your test. Do not take any antihistamines 12 hours prior to your test. If the patient has contrast allergy:  On the Day of the Test: Drink plenty of water until 1 hour prior to the test. Do not eat any food 1 hour prior to test. You may take your regular medications prior to the test.  Take metoprolol (Lopressor) two hours prior to test. If you take Furosemide/Hydrochlorothiazide/Spironolactone/Chlorthalidone, please HOLD on the morning of the test. Patients who wear a continuous glucose monitor MUST remove the device prior to scanning.   After the Test: Drink plenty of water. After receiving IV contrast, you may experience a mild flushed feeling. This is normal. On occasion, you may experience a mild rash up to 24 hours after the test. This is not dangerous. If this occurs, you can take Benadryl 25 mg and increase your fluid intake. If you experience trouble breathing, this can be serious. If it is severe call 911 IMMEDIATELY. If it is mild, please call our office.  We will call to schedule your test 2-4 weeks out understanding that some insurance companies will need an authorization prior to the service being performed.   For more information and frequently asked questions, please visit our website : [email or web address]  For non-scheduling related questions, please contact the cardiac imaging nurse navigator should you have any questions/concerns: Cardiac Imaging Nurse Navigators Direct Office Dial: [phone]   For scheduling needs, including cancellations and rescheduling, please call [place], [phone].    Follow-Up: At [facility], you and your health needs are  our priority.  As part of our continuing mission to provide you with exceptional heart care, we have created designated Provider Care Teams.  These Care Teams include your primary Cardiologist (physician) and Advanced Practice Providers (APPs -  Physician Assistants and Nurse  Practitioners) who all work together to provide you with the care you need, when you need it.  We recommend signing up for the patient portal called "MyChart".  Sign up information is provided on this After Visit Summary.  MyChart is used to connect with patients for Virtual Visits (Telemedicine).  Patients are able to view lab/test results, encounter notes, upcoming appointments, etc.  Non-urgent messages can be sent to your provider as well.   To learn more about what you can do with MyChart, go to [email or web address].    Your next appointment:   2-3 week(s)  Provider:   Gillian Shields, NP

## 2023-10-22 LAB — BASIC METABOLIC PANEL
BUN/Creatinine Ratio: 18 (ref 9–20)
BUN: 26 mg/dL — ABNORMAL HIGH (ref 6–24)
CO2: 25 mmol/L (ref 20–29)
Calcium: 9.4 mg/dL (ref 8.7–10.2)
Chloride: 102 mmol/L (ref 96–106)
Creatinine, Ser: 1.43 mg/dL — ABNORMAL HIGH (ref 0.76–1.27)
Glucose: 146 mg/dL — ABNORMAL HIGH (ref 70–99)
Potassium: 4.3 mmol/L (ref 3.5–5.2)
Sodium: 140 mmol/L (ref 134–144)
eGFR: 58 mL/min/{1.73_m2} — ABNORMAL LOW (ref >59)

## 2023-11-03 ENCOUNTER — Telehealth (HOSPITAL_COMMUNITY): Payer: Self-pay | Admitting: *Deleted

## 2023-11-03 NOTE — Telephone Encounter (Signed)
Reaching out to patient to offer assistance regarding upcoming cardiac imaging study; pt verbalizes understanding of appt date/time, parking situation and where to check in, pre-test NPO status, and verified current allergies; name and call back number provided for further questions should they arise  Larey Brick RN Navigator Cardiac Imaging [facility] [phone] office [phone] cell  Patient to take his daily medication for test. He is aware to arrive at 12:30 PM.

## 2023-11-07 ENCOUNTER — Ambulatory Visit (HOSPITAL_COMMUNITY)
Admission: RE | Admit: 2023-11-07 | Discharge: 2023-11-07 | Disposition: A | Discharge status: Home / Self Care | Payer: 59 | Source: Ambulatory Visit | Attending: Physician Assistant | Admitting: Physician Assistant

## 2023-11-07 DIAGNOSIS — I10 Essential (primary) hypertension: Secondary | ICD-10-CM | POA: Diagnosis present

## 2023-11-07 DIAGNOSIS — R072 Precordial pain: Secondary | ICD-10-CM

## 2023-11-07 DIAGNOSIS — R079 Chest pain, unspecified: Secondary | ICD-10-CM | POA: Diagnosis present

## 2023-11-07 DIAGNOSIS — G4733 Obstructive sleep apnea (adult) (pediatric): Secondary | ICD-10-CM | POA: Insufficient documentation

## 2023-11-07 MED ORDER — NITROGLYCERIN 0.4 MG SL SUBL
SUBLINGUAL_TABLET | SUBLINGUAL | Status: AC
  Filled 2023-11-07: qty 2

## 2023-11-07 MED ORDER — METOPROLOL TARTRATE 5 MG/5ML IV SOLN: 10.0000 mg | Freq: Once | INTRAVENOUS | Status: DC | PRN

## 2023-11-07 MED ORDER — IOHEXOL 350 MG/ML SOLN
100.0000 mL | Freq: Once | INTRAVENOUS | Status: AC | PRN
  Administered 2023-11-07: 100 mL via INTRAVENOUS

## 2023-11-07 MED ORDER — NITROGLYCERIN 0.4 MG SL SUBL
0.8000 mg | SUBLINGUAL_TABLET | Freq: Once | SUBLINGUAL | Status: AC
  Administered 2023-11-07: 0.8 mg via SUBLINGUAL

## 2023-11-07 MED ORDER — DILTIAZEM HCL 25 MG/5ML IV SOLN: 10.0000 mg | INTRAVENOUS | Status: DC | PRN

## 2023-11-17 ENCOUNTER — Ambulatory Visit (HOSPITAL_BASED_OUTPATIENT_CLINIC_OR_DEPARTMENT_OTHER): Payer: 59 | Admitting: Family

## 2023-11-17 DIAGNOSIS — M2550 Pain in unspecified joint: Secondary | ICD-10-CM | POA: Diagnosis not present

## 2023-11-17 DIAGNOSIS — L409 Psoriasis, unspecified: Secondary | ICD-10-CM | POA: Diagnosis not present

## 2023-11-17 DIAGNOSIS — E782 Mixed hyperlipidemia: Secondary | ICD-10-CM | POA: Diagnosis not present

## 2023-11-17 DIAGNOSIS — E039 Hypothyroidism, unspecified: Secondary | ICD-10-CM | POA: Diagnosis not present

## 2023-11-17 DIAGNOSIS — I10 Essential (primary) hypertension: Secondary | ICD-10-CM | POA: Diagnosis not present

## 2023-11-24 ENCOUNTER — Telehealth: Payer: Self-pay | Admitting: Cardiovascular Disease

## 2023-11-24 NOTE — Telephone Encounter (Signed)
 Spoke with pt and went over result note from [facility] [place]. The patient verbalized understanding.  All questions (if any) were answered.

## 2023-11-24 NOTE — Telephone Encounter (Signed)
 Patient is returning call for results. Please advise

## 2023-12-18 ENCOUNTER — Ambulatory Visit (INDEPENDENT_AMBULATORY_CARE_PROVIDER_SITE_OTHER): Payer: 59 | Admitting: Family

## 2023-12-18 VITALS — BP 133/89 | HR 66 | Ht 76.0 in | Wt 336.2 lb

## 2023-12-18 DIAGNOSIS — N281 Cyst of kidney, acquired: Secondary | ICD-10-CM | POA: Diagnosis not present

## 2023-12-18 DIAGNOSIS — I25118 Atherosclerotic heart disease of native coronary artery with other forms of angina pectoris: Secondary | ICD-10-CM

## 2023-12-18 DIAGNOSIS — I10 Essential (primary) hypertension: Secondary | ICD-10-CM | POA: Diagnosis not present

## 2023-12-18 DIAGNOSIS — E785 Hyperlipidemia, unspecified: Secondary | ICD-10-CM | POA: Diagnosis not present

## 2023-12-18 DIAGNOSIS — N2889 Other specified disorders of kidney and ureter: Secondary | ICD-10-CM | POA: Diagnosis not present

## 2023-12-18 NOTE — Progress Notes (Signed)
 Advanced Hypertension Clinic Initial Assessment:    Date:  12/22/2023   ID:  Benjamin Hogan, DOB [birth year], MRN [id number]  PCP:  Benjamin Crigler, MD  Cardiologist:  Benjamin Cash, MD  Nephrologist:  Referring MD: Benjamin Crigler, MD   CC: Hypertension  History of Present Illness:    Benjamin Hogan is a 58 y.o. male with a hx of HTN, arthritis, nonobstructive CAD, OSA on CPAP here to establish care in the Advanced Hypertension Clinic.   Prior 2022 renal artery duplex with no stenosis. 2022 CT with normal adrenal glands.  11/07/2023 coronary calcium  score 8.7.  Benjamin Hogan was diagnosed with hypertension in his 9s noted during [employer] physicals with readings being high normal with SBP 130-140/80s. 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 In 2018 suddenly BP was persistently elevated.  Had swelling on Norvasc (likely 10mg  dose, but cannot recall). Did not notice much effect on Diltiazem . Notes feels poorly in the morning being activity intolerant on Diltiazem .  Describes feeling anaerobic this has somewhat improved since moving to evening dosing but does not totally mitigate side effects. Primary team added Clonidine  and up-titrated to 0.3mg  BID. Does note side effects of dry  mouth with Clonidine .   Six months ago, more difficult BP control. Hydrochlorothiazide was swapped for chlorthalidone. Last month added Hydralazine  and Spironolactone . Takes medications routinely at 8am and 8 pm.   Blood pressure checked with arm cuff at home. Readings have been 130s/80s. he reports tobacco use never.  For exercise he has no formal routine. 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 he eats at home and does follow low sodium diet. Wearing CPAP  regularly   Previous antihypertensives: Chlorthalidone - PCP stopped when started Spironolactone   Past Medical History:  Diagnosis Date   Arthritis    diclofenac    H/O bronchitis    Hx of seasonal allergies    Hypertension    Inguinal hernia    BIL   L1 vertebral fracture (HCC)    Plantar fasciitis    Pneumonia    hx of   PONV (postoperative nausea and vomiting)    Umbilical hernia     Past Surgical History:  Procedure Laterality Date   HERNIA REPAIR     left/right inguinal, and umbilicus   INGUINAL HERNIA REPAIR Left 07/18/2015   Procedure: OPEN REPAIR OF RECURRENT LEFT INGUINAL HERNIA;  Surgeon: Donnice Lima, MD;  Location: [facility] OR;  Service: General;  Laterality: Left;   INGUINAL HERNIA REPAIR Left 03/01/2016   Procedure: LAPAROSCOPIC BILATERAL  INGUINAL HERNIA  REPAIRS AND  LYSIS OF ADHESIONS;  Surgeon: Elspeth Schultze, MD;  Location: [facility] ORS;  Service: General;  Laterality: Left;   INSERTION OF MESH Left 03/01/2016   Procedure: INSERTION OF MESH;  Surgeon: Elspeth Schultze, MD;  Location: [facility] ORS;  Service: General;  Laterality: Left;   WISDOM TOOTH EXTRACTION      Current Medications: Current Meds  Medication Sig   anastrozole  (ARIMIDEX ) 1 MG tablet Take 1 mg by mouth daily.   cloNIDine  (CATAPRES ) 0.3 MG tablet Take 0.3 mg by mouth 2 (two) times daily.   DILT-XR 180 MG 24 hr capsule SMARTSIG:1.0 Capsule(s) By Mouth Daily   hydrALAZINE  (APRESOLINE ) 25 MG tablet Take 1 tablet (25 mg total) by mouth 2 (two) times daily.  levothyroxine (SYNTHROID) 75 MCG tablet Take 1 tablet by mouth daily.   meloxicam (MOBIC) 15 MG tablet Take 15 mg by mouth daily.   methylphenidate  36 MG PO CR tablet SMARTSIG:2.0 Tablet(s) By Mouth Every Morning   olmesartan  (BENICAR ) 40 MG tablet Take 40 mg by mouth daily.   sildenafil (VIAGRA) 100 MG tablet Take 1 tablet by mouth as needed.   spironolactone  (ALDACTONE ) 50 MG tablet Take 50 mg by mouth daily.   [DISCONTINUED] cloNIDine  (CATAPRES ) 0.3 MG  tablet Take 1 tablet by mouth daily. Per patient taking in the evening     Allergies:   Amlodipine and Fire ant   Social History   Socioeconomic History   Marital status: Married    Spouse name: Benjamin Hogan   Number of children: 2   Years of education: Not on file   Highest education level: Not on file  Occupational History   Occupation: ICU Nurse    Comment: [employer]  Tobacco Use   Smoking status: Never   Smokeless tobacco: Never  Substance and Sexual Activity   Alcohol use: Yes    Comment: rarely   Drug use: No   Sexual activity: Not on file  Other Topics Concern   Not on file  Social History Narrative   Lives with his wife and their daughters, Benjamin Hogan and Benjamin Hogan.   Social Drivers of Corporate Investment Banker Strain: Not on file  Food Insecurity: No Food Insecurity (12/18/2023)   Hunger Vital Sign    Worried About Running Out of Food in the Last Year: Never true    Ran Out of Food in the Last Year: Never true  Transportation Needs: No Transportation Needs (12/18/2023)   PRAPARE - Administrator, Civil Service (Medical): No    Lack of Transportation (Non-Medical): No  Physical Activity: Sufficiently Active (12/18/2023)   Exercise Vital Sign    Days of Exercise per Week: 3 days    Minutes of Exercise per Session: 150+ min  Stress: Not on file  Social Connections: Not on file     Family History: The patient's family history includes Atrial fibrillation in his mother; Diabetes in his maternal grandmother and mother; Heart disease in his maternal grandfather, maternal grandmother, and mother; Heart failure in his mother; Hyperlipidemia in his brother, father, maternal grandmother, and mother; Hypertension in his father, maternal grandmother, mother, paternal grandmother, sister, and sister; Stroke in his maternal grandmother and paternal grandfather.  ROS:   Please see the history of present illness.     All other systems reviewed and are  negative.  EKGs/Labs/Other Studies Reviewed:         Recent Labs: 10/21/2023: BUN 26; Creatinine, Ser 1.43; Potassium 4.3; Sodium 140   Recent Lipid Panel No results found for: CHOL, TRIG, HDL, CHOLHDL, VLDL, LDLCALC, LDLDIRECT  Physical Exam:   VS:  BP 133/89 (BP Location: Left Arm, Patient Position: Sitting, Cuff Size: Large)   Pulse 66   Ht 6' 4 (1.93 m)   Wt (!) 336 lb 3.2 oz (152.5 kg)   BMI 40.92 kg/m  , BMI Body mass index is 40.92 kg/m. GENERAL:  Well appearing HEENT: Pupils equal round and reactive, fundi not visualized, oral mucosa unremarkable NECK:  No jugular venous distention, waveform within normal limits, carotid upstroke brisk and symmetric, no bruits, no thyromegaly LYMPHATICS:  No cervical adenopathy LUNGS:  Clear to auscultation bilaterally HEART:  RRR.  PMI not displaced or sustained,S1 and S2 within normal limits, no  S3, no S4, no clicks, no rubs, no murmurs ABD:  Flat, positive bowel sounds normal in frequency in pitch, no bruits, no rebound, no guarding, no midline pulsatile mass, no hepatomegaly, no splenomegaly EXT:  2 plus pulses throughout, no edema, no cyanosis no clubbing SKIN:  No rashes no nodules NEURO:  Cranial nerves II through XII grossly intact, motor grossly intact throughout PSYCH:  Cognitively intact, oriented to person place and time   ASSESSMENT/PLAN:    HTN -BP not at less than 130/80. Requests to assess for hyperaldosteronism prior to medication changes. Continue Clonidine  0.3mg  BID, Hydralazine  25mg  BID, Spironolactone  50mg  daily, Olmesartan  40mg  daily.  Consider increasing Hydralazine  at follow up   Plan to hold spironolactone  for 48 hours and get renin aldosterone, BMP to rule out hyperaldosteronism.   Ideally he would like to be off diltiazem  as feels poorly on this medication. Briefly discussed RDN, as procedure on hold could consider completing at [facility] as he is patient there if secondary workup  unremarkable.  Renal cyst- - renal duplex 2022 with incidental finding of complex cyst in the left kidney lower pole measuring 3.7 cm compared to 1.7 cm in 2017.  Was recommended for renal MRI per radiology report at that time.  Not yet completed, will order MRI abdomen.  CAD / HLD, LDL goal <70 - Stable with no anginal symptoms. No indication for ischemic evaluation.  Discuss statin at follow up. Not addressed at this clinic visit.    Screening for Secondary Hypertension:     Relevant Labs/Studies:    Latest Ref Rng & Units 10/21/2023   11:18 AM 02/20/2016    8:50 AM 07/10/2015    8:37 AM  Basic Labs  Sodium 134 - 144 mmol/L 140  145  137   Potassium 3.5 - 5.2 mmol/L 4.3  4.1  4.0   Creatinine 0.76 - 1.27 mg/dL 8.56  8.93  8.82                       Disposition:    FU with MD/APP/PharmD in 1 month    Medication Adjustments/Labs and Tests Ordered: Current medicines are reviewed at length with the patient today.  Concerns regarding medicines are outlined above.  Orders Placed This Encounter  Procedures   MR ABDOMEN WWO CONTRAST   Aldosterone + renin activity w/ ratio   Basic metabolic panel   No orders of the defined types were placed in this encounter.    Signed, Reche GORMAN Finder, NP  12/22/2023 8:42 AM    [facility]

## 2023-12-18 NOTE — Patient Instructions (Signed)
 Medication Instructions:  HOLD YOUR SPIRONOLACTONE  48 HOURS PRIOR TO LABS   Labwork: RENIN/ALDOSTERONE/BMET NEXT WEEK   Testing/Procedures: MR OF ABDOMEN   Follow-Up: 01/16/2024 9:15 AM WITH DR [facility]   If you need a refill on your cardiac medications before your next appointment, please call your pharmacy.

## 2023-12-22 ENCOUNTER — Encounter (HOSPITAL_BASED_OUTPATIENT_CLINIC_OR_DEPARTMENT_OTHER): Payer: Self-pay | Admitting: Family

## 2023-12-22 ENCOUNTER — Other Ambulatory Visit (HOSPITAL_COMMUNITY): Payer: Self-pay

## 2023-12-22 MED ORDER — SPIRONOLACTONE 50 MG PO TABS
50.0000 mg | ORAL_TABLET | Freq: Every day | ORAL | 2 refills | Status: DC
  Filled 2023-12-22 – 2023-12-25 (×2): qty 90, 90d supply, fill #0

## 2023-12-23 DIAGNOSIS — I10 Essential (primary) hypertension: Secondary | ICD-10-CM | POA: Diagnosis not present

## 2023-12-25 ENCOUNTER — Other Ambulatory Visit (HOSPITAL_COMMUNITY): Payer: Self-pay

## 2023-12-29 ENCOUNTER — Other Ambulatory Visit (HOSPITAL_COMMUNITY): Payer: Self-pay

## 2023-12-29 MED ORDER — DILTIAZEM HCL ER 180 MG PO CP24
180.0000 mg | ORAL_CAPSULE | Freq: Every day | ORAL | 3 refills | Status: DC
  Filled 2023-12-29 – 2024-01-08 (×2): qty 90, 90d supply, fill #0

## 2023-12-29 MED ORDER — OLMESARTAN MEDOXOMIL 40 MG PO TABS
40.0000 mg | ORAL_TABLET | Freq: Every day | ORAL | 3 refills | Status: DC
  Filled 2023-12-29 – 2024-01-08 (×2): qty 90, 90d supply, fill #0

## 2023-12-30 ENCOUNTER — Encounter (HOSPITAL_BASED_OUTPATIENT_CLINIC_OR_DEPARTMENT_OTHER): Payer: Self-pay

## 2023-12-30 LAB — BASIC METABOLIC PANEL
BUN/Creatinine Ratio: 16 (ref 9–20)
BUN: 21 mg/dL (ref 6–24)
CO2: 26 mmol/L (ref 20–29)
Calcium: 9.8 mg/dL (ref 8.7–10.2)
Chloride: 103 mmol/L (ref 96–106)
Creatinine, Ser: 1.35 mg/dL — ABNORMAL HIGH (ref 0.76–1.27)
Glucose: 87 mg/dL (ref 70–99)
Potassium: 4.2 mmol/L (ref 3.5–5.2)
Sodium: 143 mmol/L (ref 134–144)
eGFR: 62 mL/min/{1.73_m2} (ref >59)

## 2023-12-30 LAB — ALDOSTERONE + RENIN ACTIVITY W/ RATIO
Aldos/Renin Ratio: 1.4 (ref 0.0–30.0)
Aldosterone: 2.2 ng/dL (ref 0.0–30.0)
Renin Activity, Plasma: 1.625 ng/mL/h (ref 0.167–5.380)

## 2024-01-08 ENCOUNTER — Other Ambulatory Visit: Payer: Self-pay

## 2024-01-08 ENCOUNTER — Other Ambulatory Visit (HOSPITAL_COMMUNITY): Payer: Self-pay

## 2024-01-15 ENCOUNTER — Institutional Professional Consult (permissible substitution) (HOSPITAL_BASED_OUTPATIENT_CLINIC_OR_DEPARTMENT_OTHER): Payer: 59 | Admitting: Family

## 2024-01-16 ENCOUNTER — Encounter (HOSPITAL_BASED_OUTPATIENT_CLINIC_OR_DEPARTMENT_OTHER): Payer: 59 | Admitting: Cardiovascular Disease

## 2024-01-29 ENCOUNTER — Other Ambulatory Visit (HOSPITAL_BASED_OUTPATIENT_CLINIC_OR_DEPARTMENT_OTHER): Payer: 59

## 2024-02-19 ENCOUNTER — Encounter (HOSPITAL_BASED_OUTPATIENT_CLINIC_OR_DEPARTMENT_OTHER): Payer: 59 | Admitting: Family

## 2024-02-20 ENCOUNTER — Encounter (HOSPITAL_BASED_OUTPATIENT_CLINIC_OR_DEPARTMENT_OTHER): Payer: Self-pay

## 2024-02-20 ENCOUNTER — Ambulatory Visit (HOSPITAL_BASED_OUTPATIENT_CLINIC_OR_DEPARTMENT_OTHER)
Admission: RE | Admit: 2024-02-20 | Discharge: 2024-02-20 | Disposition: A | Discharge status: Home / Self Care | Payer: 59 | Source: Ambulatory Visit | Attending: Family | Admitting: Family

## 2024-02-20 DIAGNOSIS — N281 Cyst of kidney, acquired: Secondary | ICD-10-CM | POA: Diagnosis not present

## 2024-02-20 DIAGNOSIS — K7689 Other specified diseases of liver: Secondary | ICD-10-CM | POA: Diagnosis not present

## 2024-02-20 DIAGNOSIS — D1803 Hemangioma of intra-abdominal structures: Secondary | ICD-10-CM | POA: Diagnosis not present

## 2024-02-20 DIAGNOSIS — N2889 Other specified disorders of kidney and ureter: Secondary | ICD-10-CM | POA: Diagnosis not present

## 2024-02-20 MED ORDER — GADOBUTROL 1 MMOL/ML IV SOLN
10.0000 mL | Freq: Once | INTRAVENOUS | Status: AC | PRN
  Administered 2024-02-20: 10 mL via INTRAVENOUS
  Filled 2024-02-20: qty 10

## 2024-03-02 DIAGNOSIS — E291 Testicular hypofunction: Secondary | ICD-10-CM | POA: Diagnosis not present

## 2024-03-04 DIAGNOSIS — E291 Testicular hypofunction: Secondary | ICD-10-CM | POA: Diagnosis not present

## 2024-03-04 DIAGNOSIS — M2550 Pain in unspecified joint: Secondary | ICD-10-CM | POA: Diagnosis not present

## 2024-03-04 DIAGNOSIS — E038 Other specified hypothyroidism: Secondary | ICD-10-CM | POA: Diagnosis not present

## 2024-03-04 DIAGNOSIS — R6882 Decreased libido: Secondary | ICD-10-CM | POA: Diagnosis not present

## 2024-03-04 DIAGNOSIS — R454 Irritability and anger: Secondary | ICD-10-CM | POA: Diagnosis not present

## 2024-03-04 DIAGNOSIS — Z72820 Sleep deprivation: Secondary | ICD-10-CM | POA: Diagnosis not present

## 2024-03-04 DIAGNOSIS — Z6839 Body mass index (BMI) 39.0-39.9, adult: Secondary | ICD-10-CM | POA: Diagnosis not present

## 2024-03-04 DIAGNOSIS — R030 Elevated blood-pressure reading, without diagnosis of hypertension: Secondary | ICD-10-CM | POA: Diagnosis not present

## 2024-03-04 DIAGNOSIS — Z79890 Hormone replacement therapy (postmenopausal): Secondary | ICD-10-CM | POA: Diagnosis not present

## 2024-03-11 ENCOUNTER — Encounter (HOSPITAL_BASED_OUTPATIENT_CLINIC_OR_DEPARTMENT_OTHER): Payer: Self-pay | Admitting: Family

## 2024-03-11 ENCOUNTER — Ambulatory Visit (INDEPENDENT_AMBULATORY_CARE_PROVIDER_SITE_OTHER): Admitting: Family

## 2024-03-11 ENCOUNTER — Other Ambulatory Visit (HOSPITAL_COMMUNITY): Payer: Self-pay

## 2024-03-11 VITALS — BP 126/82 | HR 63 | Ht 76.0 in | Wt 334.1 lb

## 2024-03-11 DIAGNOSIS — I2510 Atherosclerotic heart disease of native coronary artery without angina pectoris: Secondary | ICD-10-CM

## 2024-03-11 DIAGNOSIS — G4733 Obstructive sleep apnea (adult) (pediatric): Secondary | ICD-10-CM

## 2024-03-11 DIAGNOSIS — E785 Hyperlipidemia, unspecified: Secondary | ICD-10-CM

## 2024-03-11 DIAGNOSIS — I10 Essential (primary) hypertension: Secondary | ICD-10-CM

## 2024-03-11 MED ORDER — SPIRONOLACTONE 50 MG PO TABS
50.0000 mg | ORAL_TABLET | Freq: Every day | ORAL | 3 refills | Status: AC
  Filled 2024-03-11: qty 90, 90d supply, fill #0
  Filled 2024-06-21: qty 90, 90d supply, fill #1
  Filled 2024-07-18 – 2024-09-15 (×2): qty 90, 90d supply, fill #2

## 2024-03-11 MED ORDER — HYDRALAZINE HCL 25 MG PO TABS
25.0000 mg | ORAL_TABLET | Freq: Two times a day (BID) | ORAL | 2 refills | Status: DC
  Filled 2024-03-11: qty 180, 90d supply, fill #0
  Filled 2024-07-18: qty 180, 90d supply, fill #1

## 2024-03-11 MED ORDER — CLONIDINE HCL 0.2 MG PO TABS
0.2000 mg | ORAL_TABLET | Freq: Two times a day (BID) | ORAL | 2 refills | Status: DC
  Filled 2024-03-11: qty 60, 30d supply, fill #0
  Filled 2024-05-17: qty 60, 30d supply, fill #1

## 2024-03-11 MED ORDER — OLMESARTAN MEDOXOMIL 40 MG PO TABS
40.0000 mg | ORAL_TABLET | Freq: Every day | ORAL | 3 refills | Status: AC
  Filled 2024-03-11 – 2024-07-18 (×2): qty 90, 90d supply, fill #0
  Filled 2024-10-22: qty 90, 90d supply, fill #1

## 2024-03-11 NOTE — Progress Notes (Signed)
 [facility]pertension [facility] Assessment:    Date:  03/11/2024   ID:  Benjamin Hogan, DOB 1966-06-10, MRN [id number]  PCP:  Benjamin Dunn, MD  Cardiologist:  Benjamin Batman, MD  Nephrologist:  Referring MD: Benjamin Dunn, MD   CC: Hypertension  History of Present Illness:    Benjamin Hogan is a 58 y.o. male with a hx of HTN, arthritis, nonobstructive CAD, OSA on CPAP here to follow up in the [facility].   Prior 2022 renal artery duplex with no stenosis. 2022 CT with normal adrenal glands.  11/07/2023 coronary calcium score 8.7.  Established with [facility] 12/18/23. Benjamin Hogan was diagnosed with hypertension in his 35s noted during [employer] physicals with readings being high normal with SBP 130-140/80s. 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 In 2018 suddenly BP was persistently elevated. No tobacco use. He was following low sodium diet. 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 He was wearing CPAP regularly. Subsequent labs with no evidence of hyperaldosteronism. MRI abdomen for renal cyst with small simple cyst and normal adrenals.   Presents today for follow-up independently.  Since last seen has self discontinued diltiazem  as BP was routinely controlled.  BP by home readings 130s. Home cuff found to read +10 SBP today .Notes this is improved energy level, activity tolerance in the morning.  Still notes some morning activity intolerance which he attributes to clonidine  hydralazine .  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 Reports no shortness of breath nor dyspnea on exertion. Reports no chest pain, pressure, or tightness. No edema, orthopnea, PND. Reports no palpitations.    Clinic BP 126/82 Home BP cuff 136/84  Previous antihypertensives: Chlorthalidone - PCP  stopped when started Spironolactone  Amlodipine - swelling Diltiazem  - ineffective, exercise intolerance Hydrochlorothiazide - changed to chlorthalidone  Past Medical History:  Diagnosis Date   Arthritis    diclofenac    H/O bronchitis    Hx of seasonal allergies    Hypertension    Inguinal hernia    BIL   L1 vertebral fracture (HCC)    Plantar fasciitis    Pneumonia    hx of   PONV (postoperative nausea and vomiting)    Umbilical hernia     Past Surgical History:  Procedure Laterality Date   HERNIA REPAIR     left/right inguinal, and umbilicus   INGUINAL HERNIA REPAIR Left 07/18/2015   Procedure: OPEN REPAIR OF RECURRENT LEFT INGUINAL HERNIA;  Surgeon: Dareen Ebbing, MD;  Location: [facility];  Service: General;  Laterality: Left;   INGUINAL HERNIA REPAIR Left 03/01/2016   Procedure: LAPAROSCOPIC BILATERAL  INGUINAL HERNIA  REPAIRS AND  LYSIS OF ADHESIONS;  Surgeon: Candyce Champagne, MD;  Location: [facility];  Service: General;  Laterality: Left;   INSERTION OF MESH Left 03/01/2016   Procedure: INSERTION OF MESH;  Surgeon: Candyce Champagne, MD;  Location: [facility];  Service: General;  Laterality: Left;   WISDOM TOOTH EXTRACTION      Current Medications: Current Meds  Medication Sig   anastrozole (ARIMIDEX) 1 MG tablet Take 1 mg by mouth daily.   cloNIDine  (CATAPRES ) 0.2 MG tablet Take 1 tablet (0.2 mg total) by mouth 2 (two) times daily.   levothyroxine (SYNTHROID) 75 MCG tablet Take 1 tablet by mouth daily.   meloxicam (MOBIC) 15 MG tablet Take 15 mg by mouth daily.  methylphenidate 36 MG PO CR tablet SMARTSIG:2.0 Tablet(s) By Mouth Every Morning   olmesartan  (BENICAR ) 40 MG tablet Take 1 tablet (40 mg total) by mouth daily.   Potassium 99 MG TABS Take 1 tablet by mouth daily.   sildenafil (VIAGRA) 100 MG tablet Take 1 tablet by mouth as needed.   [DISCONTINUED] cloNIDine  (CATAPRES ) 0.3 MG tablet Take 0.3 mg by mouth 2 (two) times daily.   [DISCONTINUED] DILT-XR 180 MG 24 hr capsule  SMARTSIG:1.0 Capsule(s) By Mouth Daily   [DISCONTINUED] olmesartan  (BENICAR ) 40 MG tablet Take 40 mg by mouth daily.   [DISCONTINUED] spironolactone  (ALDACTONE ) 50 MG tablet Take 50 mg by mouth daily.   [DISCONTINUED] spironolactone  (ALDACTONE ) 50 MG tablet Take 1 tablet (50 mg total) by mouth daily.     Allergies:   Amlodipine and Animator   Social History   Socioeconomic History   Marital status: Married    Spouse name: Benjamin Hogan   Number of children: 2   Years of education: Not on file   Highest education level: Not on file  Occupational History   Occupation: ICU Nurse    Comment: [employer] [place]  Tobacco Use   Smoking status: Never   Smokeless tobacco: Never  Substance and Sexual Activity   Alcohol use: Yes    Comment: rarely   Drug use: No   Sexual activity: Not on file  Other Topics Concern   Not on file  Social History Narrative   Lives with his wife and their daughters, Benjamin Hogan and Benjamin Hogan.   Social Drivers of Corporate investment banker Strain: Not on file  Food Insecurity: No Food Insecurity (12/18/2023)   Hunger Vital Sign    Worried About Running Out of Food in the Last Year: Never true    Ran Out of Food in the Last Year: Never true  Transportation Needs: No Transportation Needs (12/18/2023)   PRAPARE - Administrator, Civil Service (Medical): No    Lack of Transportation (Non-Medical): No  Physical Activity: Sufficiently Active (12/18/2023)   Exercise Vital Sign    Days of Exercise per Week: 3 days    Minutes of Exercise per Session: 150+ min  Stress: Not on file  Social Connections: Not on file     Family History: The patient's family history includes Atrial fibrillation in his mother; Diabetes in his maternal grandmother and mother; Heart disease in his maternal grandfather, maternal grandmother, and mother; Heart failure in his mother; Hyperlipidemia in his brother, father, maternal grandmother, and mother; Hypertension in his father, maternal  grandmother, mother, paternal grandmother, sister, and sister; Stroke in his maternal grandmother and paternal grandfather.  ROS:   Please see the history of present illness.     All other systems reviewed and are negative.  EKGs/Labs/Other Studies Reviewed:         Recent Labs: 12/23/2023: BUN 21; Creatinine, Ser 1.35; Potassium 4.2; Sodium 143   Recent Lipid Panel No results found for: "CHOL", "TRIG", "HDL", "CHOLHDL", "VLDL", "LDLCALC", "LDLDIRECT"  Physical Exam:   VS:  BP 126/82   Pulse 63   Ht 6\' 4"  (1.93 m)   Wt (!) 334 lb 1.6 oz (151.5 kg)   SpO2 97%   BMI 40.67 kg/m  , BMI Body mass index is 40.67 kg/m. GENERAL:  Well appearing HEENT: Pupils equal round and reactive, fundi not visualized, oral mucosa unremarkable NECK:  No jugular venous distention, waveform within normal limits, carotid upstroke brisk and symmetric, no bruits, no thyromegaly  LYMPHATICS:  No cervical adenopathy LUNGS:  Clear to auscultation bilaterally HEART:  RRR.  PMI not displaced or sustained,S1 and S2 within normal limits, no S3, no S4, no clicks, no rubs, no murmurs ABD:  Flat, positive bowel sounds normal in frequency in pitch, no bruits, no rebound, no guarding, no midline pulsatile mass, no hepatomegaly, no splenomegaly EXT:  2 plus pulses throughout, no edema, no cyanosis no clubbing SKIN:  No rashes no nodules NEURO:  Cranial nerves II through XII grossly intact, motor grossly intact throughout PSYCH:  Cognitively intact, oriented to person place and time   ASSESSMENT/PLAN:    HTN -BP at goal less than 130/80. Home BP cuff found to read +10 systolic. Remain off Diltiazem  as felt poorly.  Continue hydralazine  25 mg twice daily, olmesartan  40 mg daily spironolactone  50 mg daily. Reduce Clonidine  from 0.3 to 0.2mg  BID due to morning activity intolerance. Check in via MyChart in 3 weeks.  If BP routinely at goal less than 130/80 plan to further reduce. Discussed to monitor BP at home at least  2 hours after medications and sitting for 5-10 minutes.   Renal cyst- - renal duplex 2022 with incidental finding of complex cyst in the left kidney lower pole measuring 3.7 cm compared to 1.7 cm in 2017.  MRI abdomen 02/20/24 stable simple cyst from left kidney, no further eval needed.  CAD / HLD, LDL goal <70 - Stable with no anginal symptoms. No indication for ischemic evaluation.  Has never been on statin.  Request to make lifestyle changes and repeat lipid panel in 3 months prior to considering addition of statin. CMET/FLP in 3 months  OSA - CPAP compliance encouraged.    Screening for Secondary Hypertension:     Relevant Labs/Studies:    Latest Ref Rng & Units 12/23/2023    1:08 PM 10/21/2023   11:18 AM 02/20/2016    8:50 AM  Basic Labs  Sodium 134 - 144 mmol/L 143  140  145   Potassium 3.5 - 5.2 mmol/L 4.2  4.3  4.1   Creatinine 0.76 - 1.27 mg/dL 1.61  0.96  0.45           Latest Ref Rng & Units 12/23/2023    1:08 PM  Renin/Aldosterone   Aldosterone 0.0 - 30.0 ng/dL 2.2   Aldos/Renin Ratio 0.0 - 30.0 1.4                 Disposition:    FU with MD/APP/PharmD in 3 months   Medication Adjustments/Labs and Tests Ordered: Current medicines are reviewed at length with the patient today.  Concerns regarding medicines are outlined above.  Orders Placed This Encounter  Procedures   Lipid panel   Comprehensive metabolic panel with GFR   Meds ordered this encounter  Medications   hydrALAZINE  (APRESOLINE ) 25 MG tablet    Sig: Take 1 tablet (25 mg total) by mouth 2 (two) times daily.    Dispense:  180 tablet    Refill:  2   spironolactone  (ALDACTONE ) 50 MG tablet    Sig: Take 1 tablet (50 mg total) by mouth daily.    Dispense:  90 tablet    Refill:  3    Last office visit:[id number]   olmesartan  (BENICAR ) 40 MG tablet    Sig: Take 1 tablet (40 mg total) by mouth daily.    Dispense:  90 tablet    Refill:  3   cloNIDine  (CATAPRES ) 0.2 MG tablet    Sig: Take  1 tablet  (0.2 mg total) by mouth 2 (two) times daily.    Dispense:  60 tablet    Refill:  2    Supervising Provider:   Sheryle Donning [[id number]]     Signed, Clearnce Curia, NP  03/11/2024 11:04 AM    [facility]

## 2024-03-11 NOTE — Patient Instructions (Addendum)
 Medication Instructions:  REDUCE Clonidine  to 0.2mg  twice daily   Labwork: Your physician recommends that you return for lab work in 3 months for fasting lipid panel and CMET. Please get done 3-5 days prior to your upcoming visit.    Follow-Up: Please follow up in 3 months in [facility]IN[facility] with Dr. Theodis Fiscal, Neomi Banks, NP or Donivan Furry PharmD    Special Instructions:   Check blood pressure once per day, we will check in with 3 weeks via MyChart messagess.

## 2024-03-16 ENCOUNTER — Other Ambulatory Visit (HOSPITAL_COMMUNITY): Payer: Self-pay

## 2024-03-19 ENCOUNTER — Other Ambulatory Visit (HOSPITAL_COMMUNITY): Payer: Self-pay

## 2024-03-19 ENCOUNTER — Encounter (HOSPITAL_COMMUNITY): Payer: Self-pay

## 2024-03-19 DIAGNOSIS — F902 Attention-deficit hyperactivity disorder, combined type: Secondary | ICD-10-CM | POA: Diagnosis not present

## 2024-03-19 DIAGNOSIS — Z79899 Other long term (current) drug therapy: Secondary | ICD-10-CM | POA: Diagnosis not present

## 2024-03-19 MED ORDER — METHYLPHENIDATE HCL ER (OSM) 36 MG PO TBCR
36.0000 mg | EXTENDED_RELEASE_TABLET | Freq: Every day | ORAL | 0 refills | Status: DC
  Filled 2024-03-19: qty 180, 90d supply, fill #0

## 2024-03-22 ENCOUNTER — Other Ambulatory Visit (HOSPITAL_COMMUNITY): Payer: Self-pay

## 2024-03-23 ENCOUNTER — Other Ambulatory Visit (HOSPITAL_COMMUNITY): Payer: Self-pay

## 2024-03-23 DIAGNOSIS — Z72820 Sleep deprivation: Secondary | ICD-10-CM | POA: Diagnosis not present

## 2024-03-23 DIAGNOSIS — M2550 Pain in unspecified joint: Secondary | ICD-10-CM | POA: Diagnosis not present

## 2024-03-23 DIAGNOSIS — R454 Irritability and anger: Secondary | ICD-10-CM | POA: Diagnosis not present

## 2024-03-23 DIAGNOSIS — Z79890 Hormone replacement therapy (postmenopausal): Secondary | ICD-10-CM | POA: Diagnosis not present

## 2024-03-23 DIAGNOSIS — E038 Other specified hypothyroidism: Secondary | ICD-10-CM | POA: Diagnosis not present

## 2024-03-23 DIAGNOSIS — E291 Testicular hypofunction: Secondary | ICD-10-CM | POA: Diagnosis not present

## 2024-03-23 DIAGNOSIS — Z6841 Body Mass Index (BMI) 40.0 and over, adult: Secondary | ICD-10-CM | POA: Diagnosis not present

## 2024-03-23 DIAGNOSIS — R6882 Decreased libido: Secondary | ICD-10-CM | POA: Diagnosis not present

## 2024-03-23 MED ORDER — LIOTHYRONINE SODIUM 5 MCG PO TABS
10.0000 ug | ORAL_TABLET | Freq: Every day | ORAL | 1 refills | Status: DC
  Filled 2024-03-23: qty 180, 90d supply, fill #0
  Filled 2024-07-18: qty 180, 90d supply, fill #1

## 2024-03-23 MED ORDER — ANASTROZOLE 1 MG PO TABS
1.0000 mg | ORAL_TABLET | ORAL | 1 refills | Status: DC
  Filled 2024-03-23 (×2): qty 12, 84d supply, fill #0
  Filled 2024-04-10 – 2024-07-18 (×2): qty 12, 84d supply, fill #1

## 2024-03-31 ENCOUNTER — Encounter (HOSPITAL_BASED_OUTPATIENT_CLINIC_OR_DEPARTMENT_OTHER): Payer: Self-pay

## 2024-04-10 ENCOUNTER — Other Ambulatory Visit (HOSPITAL_COMMUNITY): Payer: Self-pay

## 2024-04-19 DIAGNOSIS — M25562 Pain in left knee: Secondary | ICD-10-CM | POA: Diagnosis not present

## 2024-04-27 DIAGNOSIS — M25562 Pain in left knee: Secondary | ICD-10-CM | POA: Diagnosis not present

## 2024-05-05 DIAGNOSIS — M25562 Pain in left knee: Secondary | ICD-10-CM | POA: Diagnosis not present

## 2024-05-17 ENCOUNTER — Other Ambulatory Visit (HOSPITAL_COMMUNITY): Payer: Self-pay

## 2024-06-10 ENCOUNTER — Encounter (HOSPITAL_BASED_OUTPATIENT_CLINIC_OR_DEPARTMENT_OTHER): Payer: Self-pay

## 2024-06-11 DIAGNOSIS — E785 Hyperlipidemia, unspecified: Secondary | ICD-10-CM | POA: Diagnosis not present

## 2024-06-12 LAB — COMPREHENSIVE METABOLIC PANEL WITH GFR
ALT: 30 IU/L (ref 0–44)
AST: 25 IU/L (ref 0–40)
Albumin: 4.5 g/dL (ref 3.8–4.9)
Alkaline Phosphatase: 75 IU/L (ref 44–121)
BUN/Creatinine Ratio: 18 (ref 9–20)
BUN: 23 mg/dL (ref 6–24)
Bilirubin Total: 0.4 mg/dL (ref 0.0–1.2)
CO2: 24 mmol/L (ref 20–29)
Calcium: 9.8 mg/dL (ref 8.7–10.2)
Chloride: 100 mmol/L (ref 96–106)
Creatinine, Ser: 1.3 mg/dL — ABNORMAL HIGH (ref 0.76–1.27)
Globulin, Total: 1.7 g/dL (ref 1.5–4.5)
Glucose: 72 mg/dL (ref 70–99)
Potassium: 5.2 mmol/L (ref 3.5–5.2)
Sodium: 140 mmol/L (ref 134–144)
Total Protein: 6.2 g/dL (ref 6.0–8.5)
eGFR: 64 mL/min/1.73 (ref >59)

## 2024-06-12 LAB — LIPID PANEL
Chol/HDL Ratio: 5.7 ratio — ABNORMAL HIGH (ref 0.0–5.0)
Cholesterol, Total: 181 mg/dL (ref 100–199)
HDL: 32 mg/dL — ABNORMAL LOW (ref >39)
LDL Chol Calc (NIH): 126 mg/dL — ABNORMAL HIGH (ref 0–99)
Triglycerides: 126 mg/dL (ref 0–149)
VLDL Cholesterol Cal: 23 mg/dL (ref 5–40)

## 2024-06-14 ENCOUNTER — Ambulatory Visit (HOSPITAL_BASED_OUTPATIENT_CLINIC_OR_DEPARTMENT_OTHER): Payer: Self-pay | Admitting: Family

## 2024-06-17 ENCOUNTER — Encounter (HOSPITAL_BASED_OUTPATIENT_CLINIC_OR_DEPARTMENT_OTHER): Payer: Self-pay

## 2024-06-17 ENCOUNTER — Ambulatory Visit (HOSPITAL_BASED_OUTPATIENT_CLINIC_OR_DEPARTMENT_OTHER): Admitting: Family

## 2024-06-17 ENCOUNTER — Other Ambulatory Visit (HOSPITAL_COMMUNITY): Payer: Self-pay

## 2024-06-17 ENCOUNTER — Encounter (HOSPITAL_BASED_OUTPATIENT_CLINIC_OR_DEPARTMENT_OTHER): Payer: Self-pay | Admitting: Family

## 2024-06-17 ENCOUNTER — Other Ambulatory Visit: Payer: Self-pay

## 2024-06-17 VITALS — BP 116/84 | HR 80 | Ht 76.0 in | Wt 306.2 lb

## 2024-06-17 DIAGNOSIS — E785 Hyperlipidemia, unspecified: Secondary | ICD-10-CM | POA: Diagnosis not present

## 2024-06-17 DIAGNOSIS — F902 Attention-deficit hyperactivity disorder, combined type: Secondary | ICD-10-CM | POA: Diagnosis not present

## 2024-06-17 DIAGNOSIS — I10 Essential (primary) hypertension: Secondary | ICD-10-CM | POA: Diagnosis not present

## 2024-06-17 DIAGNOSIS — Z79899 Other long term (current) drug therapy: Secondary | ICD-10-CM | POA: Diagnosis not present

## 2024-06-17 MED ORDER — CLONIDINE HCL 0.1 MG PO TABS
0.1000 mg | ORAL_TABLET | Freq: Two times a day (BID) | ORAL | 1 refills | Status: DC
  Filled 2024-06-17 (×2): qty 180, 90d supply, fill #0
  Filled 2024-09-15: qty 180, 90d supply, fill #1

## 2024-06-17 MED ORDER — METHYLPHENIDATE HCL ER (OSM) 36 MG PO TBCR
36.0000 mg | EXTENDED_RELEASE_TABLET | Freq: Every day | ORAL | 0 refills | Status: DC
  Filled 2024-06-17: qty 180, 90d supply, fill #0

## 2024-06-17 MED ORDER — ROSUVASTATIN CALCIUM 10 MG PO TABS
10.0000 mg | ORAL_TABLET | ORAL | 1 refills | Status: AC
Start: 2024-06-18 — End: 2024-12-17
  Filled 2024-06-17: qty 38, 89d supply, fill #0
  Filled 2024-07-18 – 2024-09-15 (×2): qty 38, 89d supply, fill #1

## 2024-06-17 NOTE — Patient Instructions (Addendum)
 Medication Instructions:  REDUCE Clonidine  0.1 mg twice per day  START Rosuvastatin  10 mg three times per week  Labs: Your physician recommends that you return for lab work in 2 months for fasting lipid panel, liver function panel   Follow-Up: Please follow up in 4 months in [facility] HTN CLINIC with Dr. Raford, Reche Finder, NP or Allean Mink PharmD    Special Instructions:   We will send a MyChart message in 3 weeks to check in on BP. If still routinely less than 130/80, we will consider stopping Clonidine .   Heart Healthy Diet Recommendations: A low-salt diet is recommended. Meats should be grilled, baked, or boiled. Avoid fried foods. Focus on lean protein sources like fish or chicken with vegetables and fruits. The American Heart Association is a Chief Technology Officer!  American Heart Association Diet and Lifeystyle Recommendations   Exercise recommendations: The American Heart Association recommends 150 minutes of moderate intensity exercise weekly. Try 30 minutes of moderate intensity exercise 4-5 times per week. This could include walking, jogging, or swimming.

## 2024-06-17 NOTE — Progress Notes (Signed)
 Advanced Hypertension Clinic Assessment:    Date:  06/17/2024   ID:  Benjamin Hogan, DOB 16-Feb-1966, MRN [id number]  PCP:  Ilah Crigler, MD  Cardiologist:  Lonni Cash, MD  Nephrologist:  Referring MD: Ilah Crigler, MD   CC: Hypertension  History of Present Illness:    Benjamin Hogan is a 58 y.o. male with a hx of HTN, arthritis, nonobstructive CAD, OSA on CPAP here to follow up in the [facility] Hypertension Clinic.   Prior 2022 renal artery duplex with no stenosis. 2022 CT with normal adrenal glands.  11/07/2023 coronary calcium  score 8.7.  Established with [facility] Clinic 12/18/23. Benjamin Hogan was diagnosed with hypertension in his 40s noted during [employer] physicals with readings being high normal with SBP 130-140/80s. 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 In 2018 suddenly BP was persistently elevated. No tobacco use. He was following low sodium diet. 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 He was wearing CPAP regularly. Subsequent labs with no evidence of hyperaldosteronism. MRI abdomen for renal cyst with small simple cyst and normal adrenals.   Last seen 03/11/2024.  BP was at goal less than 130/80.  Clonidine  was reduced from 0.3 mg to 0.2 mg twice daily.  He was off diltiazem  and felt improved in the mornings. Home cuff found to read 10 points higher than manual reading.   Presents today for follow-up independently.  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  Offered congratulations.  [facility] has started him on Tirzepatide with successful weight loss. SBP 130-140 while at work running around. Interested in further reducing Clonidine . Reports no shortness of breath nor dyspnea on exertion. Reports no chest pain, pressure, or tightness. No edema, orthopnea, PND. Reports no palpitations.    Previous  antihypertensives: Chlorthalidone - PCP stopped when started Spironolactone  Amlodipine - swelling Diltiazem  - ineffective, exercise intolerance Hydrochlorothiazide - changed to chlorthalidone  Past Medical History:  Diagnosis Date   Arthritis    diclofenac    H/O bronchitis    Hx of seasonal allergies    Hypertension    Inguinal hernia    BIL   L1 vertebral fracture (HCC)    Plantar fasciitis    Pneumonia    hx of   PONV (postoperative nausea and vomiting)    Umbilical hernia     Past Surgical History:  Procedure Laterality Date   HERNIA REPAIR     left/right inguinal, and umbilicus   INGUINAL HERNIA REPAIR Left 07/18/2015   Procedure: OPEN REPAIR OF RECURRENT LEFT INGUINAL HERNIA;  Surgeon: Donnice Lima, MD;  Location: [facility] OR;  Service: General;  Laterality: Left;   INGUINAL HERNIA REPAIR Left 03/01/2016   Procedure: LAPAROSCOPIC BILATERAL  INGUINAL HERNIA  REPAIRS AND  LYSIS OF ADHESIONS;  Surgeon: Elspeth Schultze, MD;  Location: [facility];  Service: General;  Laterality: Left;   INSERTION OF MESH Left 03/01/2016   Procedure: INSERTION OF MESH;  Surgeon: Elspeth Schultze, MD;  Location: [facility];  Service: General;  Laterality: Left;   WISDOM TOOTH EXTRACTION      Current Medications: Current Meds  Medication Sig   anastrozole  (ARIMIDEX ) 1 MG tablet Take 1 mg by mouth daily.   anastrozole  (ARIMIDEX ) 1 MG tablet Take 1 tablet (1 mg total) by mouth once a week.   cloNIDine  (CATAPRES ) 0.2 MG tablet Take 1 tablet (0.2 mg total) by mouth  2 (two) times daily.   hydrALAZINE  (APRESOLINE ) 25 MG tablet Take 1 tablet (25 mg total) by mouth 2 (two) times daily.   levothyroxine (SYNTHROID) 75 MCG tablet Take 1 tablet by mouth daily.   liothyronine  (CYTOMEL ) 5 MCG tablet Take 2 tablets (10 mcg total) by mouth daily.   meloxicam (MOBIC) 15 MG tablet Take 15 mg by mouth daily.   methylphenidate  (CONCERTA ) 36 MG PO CR tablet Take 1-2 tablets (36-72 mg total) by mouth daily.   methylphenidate  36 MG PO  CR tablet SMARTSIG:2.0 Tablet(s) By Mouth Every Morning   olmesartan  (BENICAR ) 40 MG tablet Take 1 tablet (40 mg total) by mouth daily.   Potassium 99 MG TABS Take 1 tablet by mouth daily.   sildenafil (VIAGRA) 100 MG tablet Take 1 tablet by mouth as needed.   spironolactone  (ALDACTONE ) 50 MG tablet Take 1 tablet (50 mg total) by mouth daily.     Allergies:   Amlodipine and Animator   Social History   Socioeconomic History   Marital status: Married    Spouse name: Nicolis Boody   Number of children: 2   Years of education: Not on file   Highest education level: Not on file  Occupational History   Occupation: ICU Nurse    Comment: [place]  Tobacco Use   Smoking status: Never   Smokeless tobacco: Never  Substance and Sexual Activity   Alcohol use: Yes    Comment: rarely   Drug use: No   Sexual activity: Not on file  Other Topics Concern   Not on file  Social History Narrative   Lives with his wife and their daughters, [place] and [place].   Social Drivers of Corporate investment banker Strain: Not on file  Food Insecurity: No Food Insecurity (12/18/2023)   Hunger Vital Sign    Worried About Running Out of Food in the Last Year: Never true    Ran Out of Food in the Last Year: Never true  Transportation Needs: No Transportation Needs (12/18/2023)   PRAPARE - Administrator, Civil Service (Medical): No    Lack of Transportation (Non-Medical): No  Physical Activity: Sufficiently Active (12/18/2023)   Exercise Vital Sign    Days of Exercise per Week: 3 days    Minutes of Exercise per Session: 150+ min  Stress: Not on file  Social Connections: Not on file     Family History: The patient's family history includes Atrial fibrillation in his mother; Diabetes in his maternal grandmother and mother; Heart disease in his maternal grandfather, maternal grandmother, and mother; Heart failure in his mother; Hyperlipidemia in his brother, father, maternal grandmother, and  mother; Hypertension in his father, maternal grandmother, mother, paternal grandmother, sister, and sister; Stroke in his maternal grandmother and paternal grandfather.  ROS:   Please see the history of present illness.     All other systems reviewed and are negative.  EKGs/Labs/Other Studies Reviewed:         Recent Labs: 06/11/2024: ALT 30; BUN 23; Creatinine, Ser 1.30; Potassium 5.2; Sodium 140   Recent Lipid Panel    Component Value Date/Time   CHOL 181 06/11/2024 1155   TRIG 126 06/11/2024 1155   HDL 32 (L) 06/11/2024 1155   CHOLHDL 5.7 (H) 06/11/2024 1155   LDLCALC 126 (H) 06/11/2024 1155    Physical Exam:   VS:  BP 116/84 (BP Location: Left Arm)   Pulse 80   Ht 6' 4 (1.93 m)  Wt (!) 306 lb 3.2 oz (138.9 kg)   SpO2 96%   BMI 37.27 kg/m  , BMI Body mass index is 37.27 kg/m. GENERAL:  Well appearing HEENT: Pupils equal round and reactive, fundi not visualized, oral mucosa unremarkable NECK:  No jugular venous distention, waveform within normal limits, carotid upstroke brisk and symmetric, no bruits, no thyromegaly LYMPHATICS:  No cervical adenopathy LUNGS:  Clear to auscultation bilaterally HEART:  RRR.  PMI not displaced or sustained,S1 and S2 within normal limits, no S3, no S4, no clicks, no rubs, no murmurs ABD:  Flat, positive bowel sounds normal in frequency in pitch, no bruits, no rebound, no guarding, no midline pulsatile mass, no hepatomegaly, no splenomegaly EXT:  2 plus pulses throughout, no edema, no cyanosis no clubbing SKIN:  No rashes no nodules NEURO:  Cranial nerves II through XII grossly intact, motor grossly intact throughout PSYCH:  Cognitively intact, oriented to person place and time   ASSESSMENT/PLAN:    HTN -BP at goal less than 130/80. Home BP cuff found to read +10 systolic previously.  BP at goal in clinic.  Remain off Diltiazem  as felt poorly.  Continue hydralazine  25 mg twice daily, olmesartan  40 mg daily spironolactone  50 mg  daily. Reduce Clonidine  from 0.2 to 0.1mg  BID due to relative hypotension, dry  mouth. Check in via MyChart in 3 weeks.  If BP routinely at goal less than 130/80 plan to discontinue. Discussed to monitor BP at home at least 2 hours after medications and sitting for 5-10 minutes.   Renal cyst-  renal duplex 2022 with incidental finding of complex cyst in the left kidney lower pole measuring 3.7 cm compared to 1.7 cm in 2017.  MRI abdomen 02/20/24 stable simple cyst from left kidney, no further eval needed.  CAD / HLD, LDL goal <70 - Stable with no anginal symptoms. No indication for ischemic evaluation.  Has never been on statin. 06/11/24 LDL 126. 06/02/24 LDL 138. Somewhat hesitant regarding statin but agreeable to start rosuvastatin  10 mg 3 times per week with repeat fasting lipid panel, liver function in 2 months.  OSA - CPAP compliance encouraged.    Screening for Secondary Hypertension:     Relevant Labs/Studies:    Latest Ref Rng & Units 06/11/2024   11:55 AM 12/23/2023    1:08 PM 10/21/2023   11:18 AM  Basic Labs  Sodium 134 - 144 mmol/L 140  143  140   Potassium 3.5 - 5.2 mmol/L 5.2  4.2  4.3   Creatinine 0.76 - 1.27 mg/dL 8.69  8.64  8.56           Latest Ref Rng & Units 12/23/2023    1:08 PM  Renin/Aldosterone   Aldosterone 0.0 - 30.0 ng/dL 2.2   Aldos/Renin Ratio 0.0 - 30.0 1.4               Disposition:    FU with MD/APP/PharmD in 4 months   Medication Adjustments/Labs and Tests Ordered: Current medicines are reviewed at length with the patient today.  Concerns regarding medicines are outlined above.  No orders of the defined types were placed in this encounter.  No orders of the defined types were placed in this encounter.    Signed, Reche GORMAN Finder, NP  06/17/2024 10:05 AM    [facility]

## 2024-06-18 ENCOUNTER — Other Ambulatory Visit (HOSPITAL_COMMUNITY): Payer: Self-pay

## 2024-06-21 ENCOUNTER — Other Ambulatory Visit (HOSPITAL_COMMUNITY): Payer: Self-pay

## 2024-07-19 ENCOUNTER — Other Ambulatory Visit (HOSPITAL_COMMUNITY): Payer: Self-pay

## 2024-07-19 ENCOUNTER — Other Ambulatory Visit: Payer: Self-pay

## 2024-08-06 DIAGNOSIS — M25561 Pain in right knee: Secondary | ICD-10-CM | POA: Diagnosis not present

## 2024-08-17 DIAGNOSIS — E291 Testicular hypofunction: Secondary | ICD-10-CM | POA: Diagnosis not present

## 2024-08-17 DIAGNOSIS — M25561 Pain in right knee: Secondary | ICD-10-CM | POA: Diagnosis not present

## 2024-08-17 DIAGNOSIS — Z79890 Hormone replacement therapy (postmenopausal): Secondary | ICD-10-CM | POA: Diagnosis not present

## 2024-08-17 DIAGNOSIS — E038 Other specified hypothyroidism: Secondary | ICD-10-CM | POA: Diagnosis not present

## 2024-08-19 ENCOUNTER — Other Ambulatory Visit (HOSPITAL_COMMUNITY): Payer: Self-pay

## 2024-08-19 DIAGNOSIS — E291 Testicular hypofunction: Secondary | ICD-10-CM | POA: Diagnosis not present

## 2024-08-19 DIAGNOSIS — M2550 Pain in unspecified joint: Secondary | ICD-10-CM | POA: Diagnosis not present

## 2024-08-19 DIAGNOSIS — Z79890 Hormone replacement therapy (postmenopausal): Secondary | ICD-10-CM | POA: Diagnosis not present

## 2024-08-19 DIAGNOSIS — Z6841 Body Mass Index (BMI) 40.0 and over, adult: Secondary | ICD-10-CM | POA: Diagnosis not present

## 2024-08-19 DIAGNOSIS — E038 Other specified hypothyroidism: Secondary | ICD-10-CM | POA: Diagnosis not present

## 2024-08-19 MED ORDER — ANASTROZOLE 1 MG PO TABS
1.0000 mg | ORAL_TABLET | ORAL | 1 refills | Status: AC
  Filled 2024-08-19: qty 15, 105d supply, fill #0
  Filled 2024-10-22: qty 13, 90d supply, fill #0

## 2024-08-19 MED ORDER — LIOTHYRONINE SODIUM 5 MCG PO TABS
10.0000 ug | ORAL_TABLET | Freq: Every day | ORAL | 1 refills | Status: AC
  Filled 2024-08-19 – 2024-10-22 (×3): qty 180, 90d supply, fill #0

## 2024-08-27 DIAGNOSIS — M170 Bilateral primary osteoarthritis of knee: Secondary | ICD-10-CM | POA: Diagnosis not present

## 2024-08-27 DIAGNOSIS — M23231 Derangement of other medial meniscus due to old tear or injury, right knee: Secondary | ICD-10-CM | POA: Diagnosis not present

## 2024-08-27 DIAGNOSIS — M23207 Derangement of unspecified meniscus due to old tear or injury, left knee: Secondary | ICD-10-CM | POA: Diagnosis not present

## 2024-09-16 ENCOUNTER — Other Ambulatory Visit (HOSPITAL_COMMUNITY): Payer: Self-pay

## 2024-09-16 ENCOUNTER — Other Ambulatory Visit: Payer: Self-pay

## 2024-09-23 ENCOUNTER — Other Ambulatory Visit (HOSPITAL_COMMUNITY): Payer: Self-pay

## 2024-09-23 DIAGNOSIS — F902 Attention-deficit hyperactivity disorder, combined type: Secondary | ICD-10-CM | POA: Diagnosis not present

## 2024-09-23 DIAGNOSIS — Z79899 Other long term (current) drug therapy: Secondary | ICD-10-CM | POA: Diagnosis not present

## 2024-09-23 MED ORDER — METHYLPHENIDATE HCL ER (OSM) 36 MG PO TBCR
36.0000 mg | EXTENDED_RELEASE_TABLET | Freq: Every day | ORAL | 0 refills | Status: AC
  Filled 2024-09-23: qty 180, 90d supply, fill #0

## 2024-09-24 ENCOUNTER — Other Ambulatory Visit (HOSPITAL_COMMUNITY): Payer: Self-pay

## 2024-10-13 ENCOUNTER — Other Ambulatory Visit (HOSPITAL_COMMUNITY): Payer: Self-pay

## 2024-10-13 ENCOUNTER — Ambulatory Visit (INDEPENDENT_AMBULATORY_CARE_PROVIDER_SITE_OTHER): Admitting: Family

## 2024-10-13 ENCOUNTER — Encounter (HOSPITAL_BASED_OUTPATIENT_CLINIC_OR_DEPARTMENT_OTHER): Payer: Self-pay | Admitting: Family

## 2024-10-13 VITALS — BP 132/92 | HR 77 | Ht 76.0 in | Wt 287.4 lb

## 2024-10-13 DIAGNOSIS — I10 Essential (primary) hypertension: Secondary | ICD-10-CM

## 2024-10-13 DIAGNOSIS — E785 Hyperlipidemia, unspecified: Secondary | ICD-10-CM | POA: Diagnosis not present

## 2024-10-13 DIAGNOSIS — R252 Cramp and spasm: Secondary | ICD-10-CM

## 2024-10-13 MED ORDER — CLONIDINE HCL 0.1 MG PO TABS
ORAL_TABLET | ORAL | 1 refills | Status: AC
  Filled 2024-10-13: qty 180, fill #0
  Filled 2024-10-22 – 2024-11-04 (×2): qty 180, 60d supply, fill #0

## 2024-10-13 NOTE — Progress Notes (Signed)
 [facility] Assessment:    Date:  10/13/2024   ID:  Benjamin Hogan, DOB 01/10/1966, MRN [id number]  PCP:  Ilah Crigler, MD  Cardiologist:  Lonni Cash, MD  Nephrologist:  Referring MD: Ilah Crigler, MD   CC: Hypertension  History of Present Illness:    Benjamin Hogan is a 58 y.o. male with a hx of HTN, arthritis, nonobstructive CAD, OSA on CPAP here to follow up in the [facility].   Prior 2022 renal artery duplex with no stenosis. 2022 CT with normal adrenal glands.  11/07/2023 coronary calcium  score 8.7.  Established with [facility] 12/18/23. Benjamin Hogan was diagnosed with hypertension in his 24s noted during [employer] physical[employer] with readings being high normal with SBP 130-140/80s. 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 In 2018 suddenly BP was persistently elevated. No tobacco use. He was following low sodium diet. 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 He was wearing CPAP regularly. Subsequent labs with no evidence of hyperaldosteronism. MRI abdomen for renal cyst with small simple cyst and normal adrenals.   At visit 03/11/2024  BP was at goal less than 130/80.  Clonidine  was reduced from 0.3 mg to 0.2 mg twice daily.  He was off diltiazem  and felt improved in the mornings. Home cuff found to read 10 points higher than manual reading.   At follow-up 06/17/24 had completed NP school and passed boards.  He was hunters [place] to the [place] with successful weight loss.  Clonidine  further reduced to 0.1 mg twice daily.  As labs 06/11/2024 LDL 126 he was recommended for rosuvastatin  10 mg 3 times per week with repeat lipid panel in 2 months.  Via subsequent MyChart message regimen adjusted to clonidine  0.2 mg twice daily, olmesartan  40 mg daily, spironolactone  50 mg daily and hydralazine   discontinued.  Presents today for follow-up independently. BP in the morning 120s/80s. Will sometimes creep up 130-140s in the afternoon at work. He attributes this to having to take medications earlier on days he works. 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 Hopeful to have more routine schedule. Feels weight has stabailized on Tirzepatide. Transient nausea which is overall not bothersome. Injection site edema 3 days after injection with small red spot which does itch. He has some constipation and seeing GI next week. Due for repeat lipid panel. Does note some leg cramping possibly associated with Rosuvastatin . Reviewed LDL goal <70.  Previous antihypertensives: Chlorthalidone - PCP stopped when started Spironolactone  Amlodipine - swelling Diltiazem  - ineffective, exercise intolerance Hydrochlorothiazide - changed to chlorthalidone Hydralazine -low BP no longer, needed  Past Medical History:  Diagnosis Date   Arthritis    diclofenac    H/O bronchitis    Hx of seasonal allergies    Hypertension    Inguinal hernia    BIL   L1 vertebral fracture (HCC)    Plantar fasciitis    Pneumonia    hx of   PONV (postoperative nausea and vomiting)    Umbilical hernia     Past Surgical History:  Procedure Laterality Date   HERNIA REPAIR     left/right inguinal, and umbilicus   INGUINAL HERNIA REPAIR Left 07/18/2015   Procedure: OPEN REPAIR OF RECURRENT LEFT INGUINAL HERNIA;  Surgeon: Donnice Lima, MD;  Location: [facility] OR;  Service: General;  Laterality: Left;  INGUINAL HERNIA REPAIR Left 03/01/2016   Procedure: LAPAROSCOPIC BILATERAL  INGUINAL HERNIA  REPAIRS AND  LYSIS OF ADHESIONS;  Surgeon: Elspeth Schultze, MD;  Location: [facility];  Service: General;  Laterality: Left;   INSERTION OF MESH Left 03/01/2016   Procedure: INSERTION OF MESH;  Surgeon: Elspeth Schultze, MD;  Location: [facility];  Service: General;  Laterality: Left;   WISDOM TOOTH EXTRACTION       Current Medications: Current Meds  Medication Sig   anastrozole  (ARIMIDEX ) 1 MG tablet Take 1 tablet (1 mg total) by mouth once a week.   levothyroxine (SYNTHROID) 75 MCG tablet Take 1 tablet by mouth daily.   liothyronine  (CYTOMEL ) 5 MCG tablet Take 2 tablets (10 mcg total) by mouth daily.   meloxicam (MOBIC) 15 MG tablet Take 15 mg by mouth daily.   methylphenidate  (CONCERTA ) 36 MG PO CR tablet Take 1-2 tablets (36-72 mg total) by mouth daily.   methylphenidate  36 MG PO CR tablet SMARTSIG:2.0 Tablet(s) By Mouth Every Morning   olmesartan  (BENICAR ) 40 MG tablet Take 1 tablet (40 mg total) by mouth daily.   Potassium 99 MG TABS Take 1 tablet by mouth daily.   rosuvastatin  (CRESTOR ) 10 MG tablet Take 1 tablet (10 mg total) by mouth 3 (three) times a week.   sildenafil (VIAGRA) 100 MG tablet Take 1 tablet by mouth as needed.   spironolactone  (ALDACTONE ) 50 MG tablet Take 1 tablet (50 mg total) by mouth daily.   Testosterone  200 MG PLLT See admin instructions.   tirzepatide (MOUNJARO) 7.5 MG/0.5ML Pen Inject 7.5 mg into the skin once a week.   [DISCONTINUED] cloNIDine  (CATAPRES ) 0.1 MG tablet Take 1 tablet (0.1 mg total) by mouth 2 (two) times daily. (Patient taking differently: Take 0.1 mg by mouth 2 (two) times daily. 0.1 mg in moring and 0.2 mg at night)     Allergies:   Amlodipine and Fire ant   Social History   Socioeconomic History   Marital status: Married    Spouse name: Taiquan Campanaro   Number of children: 2   Years of education: Not on file   Highest education level: Not on file  Occupational History   Occupation: ICU Nurse    Comment: [employer]  Tobacco Use   Smoking status: Never   Smokeless tobacco: Never  Substance and Sexual Activity   Alcohol use: Yes    Comment: rarely   Drug use: No   Sexual activity: Not on file  Other Topics Concern   Not on file  Social History Narrative   Lives with his wife and their daughters, [place] and [place].   Social  Drivers of Corporate Investment Banker Strain: Not on file  Food Insecurity: No Food Insecurity (12/18/2023)   Hunger Vital Sign    Worried About Running Out of Food in the Last Year: Never true    Ran Out of Food in the Last Year: Never true  Transportation Needs: No Transportation Needs (12/18/2023)   PRAPARE - Administrator, Civil Service (Medical): No    Lack of Transportation (Non-Medical): No  Physical Activity: Sufficiently Active (12/18/2023)   Exercise Vital Sign    Days of Exercise per Week: 3 days    Minutes of Exercise per Session: 150+ min  Stress: Not on file  Social Connections: Not on file     Family History: The patient's family history includes Atrial fibrillation in his mother; Diabetes in his maternal grandmother and mother; Heart disease in  his maternal grandfather, maternal grandmother, and mother; Heart failure in his mother; Hyperlipidemia in his brother, father, maternal grandmother, and mother; Hypertension in his father, maternal grandmother, mother, paternal grandmother, sister, and sister; Stroke in his maternal grandmother and paternal grandfather.  ROS:   Please see the history of present illness.     All other systems reviewed and are negative.  EKGs/Labs/Other Studies Reviewed:    EKG Interpretation Date/Time:  Wednesday October 13 2024 13:33:07 EST Ventricular Rate:  74 PR Interval:  158 QRS Duration:  90 QT Interval:  380 QTC Calculation: 421 R Axis:   73  Text Interpretation: Normal sinus rhythm Normal ECG Confirmed by Vannie Mora ([id number]) on 10/13/2024 1:38:55 PM    Recent Labs: 06/11/2024: ALT 30; BUN 23; Creatinine, Ser 1.30; Potassium 5.2; Sodium 140   Recent Lipid Panel    Component Value Date/Time   CHOL 181 06/11/2024 1155   TRIG 126 06/11/2024 1155   HDL 32 (L) 06/11/2024 1155   CHOLHDL 5.7 (H) 06/11/2024 1155   LDLCALC 126 (H) 06/11/2024 1155    Physical Exam:   VS:  BP (!) 132/92   Pulse 77   Ht 6' 4 (1.93 m)    Wt 287 lb 6.4 oz (130.4 kg)   SpO2 97%   BMI 34.98 kg/m  , BMI Body mass index is 34.98 kg/m. GENERAL:  Well appearing HEENT: Pupils equal round and reactive, fundi not visualized, oral mucosa unremarkable NECK:  No jugular venous distention, waveform within normal limits, carotid upstroke brisk and symmetric, no bruits, no thyromegaly LYMPHATICS:  No cervical adenopathy LUNGS:  Clear to auscultation bilaterally HEART:  RRR.  PMI not displaced or sustained,S1 and S2 within normal limits, no S3, no S4, no clicks, no rubs, no murmurs ABD:  Flat, positive bowel sounds normal in frequency in pitch, no bruits, no rebound, no guarding, no midline pulsatile mass, no hepatomegaly, no splenomegaly EXT:  2 plus pulses throughout, no edema, no cyanosis no clubbing SKIN:  No rashes no nodules NEURO:  Cranial nerves II through XII grossly intact, motor grossly intact throughout PSYCH:  Cognitively intact, oriented to person place and time   ASSESSMENT/PLAN:    HTN -BP at goal less than 130/80 by home readings most often. Home BP cuff found to read +10 systolic previously.  Previously felt poorly on Diltiazem , Hydralazine . Continue Clonidine  0.1mg  AM and 0.2mg  PM, olmesartan  40 mg daily spironolactone  50 mg daily. If BP routinely <120/80 can further reduce Clonidine  Discussed to monitor BP at home at least 2 hours after medications and sitting for 5-10 minutes. Recommend aiming for 150 minutes of moderate intensity activity per week and following a heart healthy diet.    Renal cyst-  renal duplex 2022 with incidental finding of complex cyst in the left kidney lower pole measuring 3.7 cm compared to 1.7 cm in 2017.  MRI abdomen 02/20/24 stable simple cyst from left kidney, no further eval needed.  CAD / HLD, LDL goal <70 - Stable with no anginal symptoms. No indication for ischemic evaluation.  06/11/24 LDL 126. He was started on Rosuvastatin  10 mg 3 times. Due for repeat fasting lipid panel, CMP - he  will have done within the next couple weeks. If LDL not at goal <70, consider increasing to daily dosing. Dose note some leg cramps - magnesium and potassium with labs - if unremarkable consider 2 week statin holiday. Given information on bempedoic acid to review for non-statin agent.   OSA - CPAP compliance encouraged.  Leg cramps - update BMET, magnesium. If unremarkable, consider 2 week statin holiday.   Screening for Secondary Hypertension:     Relevant Labs/Studies:    Latest Ref Rng & Units 06/11/2024   11:55 AM 12/23/2023    1:08 PM 10/21/2023   11:18 AM  Basic Labs  Sodium 134 - 144 mmol/L 140  143  140   Potassium 3.5 - 5.2 mmol/L 5.2  4.2  4.3   Creatinine 0.76 - 1.27 mg/dL 8.69  8.64  8.56           Latest Ref Rng & Units 12/23/2023    1:08 PM  Renin/Aldosterone   Aldosterone 0.0 - 30.0 ng/dL 2.2   Aldos/Renin Ratio 0.0 - 30.0 1.4               Disposition:    FU with MD/APP/PharmD in 6 months   Medication Adjustments/Labs and Tests Ordered: Current medicines are reviewed at length with the patient today.  Concerns regarding medicines are outlined above.  Orders Placed This Encounter  Procedures   Comprehensive metabolic panel with GFR   Magnesium   Lipid panel   EKG 12-Lead   Meds ordered this encounter  Medications   cloNIDine  (CATAPRES ) 0.1 MG tablet    Sig: Take 1 tablet (0.1 mg total) by mouth every morning AND 2 tablets (0.2 mg total) every evening.    Dispense:  180 tablet    Refill:  1    Supervising Provider:   LONNI SLAIN [[id number]]     Signed, Reche GORMAN Finder, NP  10/13/2024 2:46 PM    [facility]

## 2024-10-13 NOTE — Patient Instructions (Addendum)
 Medication Instructions:  Continue your current medications  *If you need a refill on your cardiac medications before your next appointment, please call your pharmacy*  Lab Work: Your physician recommends that you return for lab work one day in the next couple weeks for : fasting lipid panel, CMP, magnesium   If you have labs (blood work) drawn today and your tests are completely normal, you will receive your results only by: MyChart Message (if you have MyChart) OR A paper copy in the mail If you have any lab test that is abnormal or we need to change your treatment, we will call you to review the results.  Follow-Up: At [facility], you and your health needs are our priority.  As part of our continuing mission to provide you with exceptional heart care, our providers are all part of one team.  This team includes your primary Cardiologist (physician) and Advanced Practice Providers or APPs (Physician Assistants and Nurse Practitioners) who all work together to provide you with the care you need, when you need it.  Your next appointment:   4-6 month(s) in [facility] Clinic   We recommend signing up for the patient portal called MyChart.  Sign up information is provided on this After Visit Summary.  MyChart is used to connect with patients for Virtual Visits (Telemedicine).  Patients are able to view lab/test results, encounter notes, upcoming appointments, etc.  Non-urgent messages can be sent to your provider as well.   To learn more about what you can do with MyChart, go to [email or web address].

## 2024-10-20 DIAGNOSIS — R252 Cramp and spasm: Secondary | ICD-10-CM | POA: Diagnosis not present

## 2024-10-20 DIAGNOSIS — I10 Essential (primary) hypertension: Secondary | ICD-10-CM | POA: Diagnosis not present

## 2024-10-20 DIAGNOSIS — E785 Hyperlipidemia, unspecified: Secondary | ICD-10-CM | POA: Diagnosis not present

## 2024-10-20 LAB — LIPID PANEL
Chol/HDL Ratio: 3.6 ratio (ref 0.0–5.0)
Cholesterol, Total: 134 mg/dL (ref 100–199)
HDL: 37 mg/dL — ABNORMAL LOW (ref >39)
LDL Chol Calc (NIH): 81 mg/dL (ref 0–99)
Triglycerides: 79 mg/dL (ref 0–149)
VLDL Cholesterol Cal: 16 mg/dL (ref 5–40)

## 2024-10-20 LAB — COMPREHENSIVE METABOLIC PANEL WITH GFR
ALT: 22 IU/L (ref 0–44)
AST: 25 IU/L (ref 0–40)
Albumin: 4.2 g/dL (ref 3.8–4.9)
Alkaline Phosphatase: 91 IU/L (ref 47–123)
BUN/Creatinine Ratio: 19 (ref 9–20)
BUN: 20 mg/dL (ref 6–24)
Bilirubin Total: 0.4 mg/dL (ref 0.0–1.2)
CO2: 26 mmol/L (ref 20–29)
Calcium: 9.3 mg/dL (ref 8.7–10.2)
Chloride: 103 mmol/L (ref 96–106)
Creatinine, Ser: 1.03 mg/dL (ref 0.76–1.27)
Globulin, Total: 2.1 g/dL (ref 1.5–4.5)
Glucose: 97 mg/dL (ref 70–99)
Potassium: 4.6 mmol/L (ref 3.5–5.2)
Sodium: 142 mmol/L (ref 134–144)
Total Protein: 6.3 g/dL (ref 6.0–8.5)
eGFR: 85 mL/min/1.73 (ref >59)

## 2024-10-20 LAB — MAGNESIUM: Magnesium: 2 mg/dL (ref 1.6–2.3)

## 2024-10-21 ENCOUNTER — Ambulatory Visit (HOSPITAL_BASED_OUTPATIENT_CLINIC_OR_DEPARTMENT_OTHER): Payer: Self-pay | Admitting: Family

## 2024-10-21 ENCOUNTER — Other Ambulatory Visit (HOSPITAL_COMMUNITY): Payer: Self-pay

## 2024-10-21 DIAGNOSIS — E7849 Other hyperlipidemia: Secondary | ICD-10-CM

## 2024-10-21 DIAGNOSIS — Z8249 Family history of ischemic heart disease and other diseases of the circulatory system: Secondary | ICD-10-CM

## 2024-10-21 DIAGNOSIS — E785 Hyperlipidemia, unspecified: Secondary | ICD-10-CM

## 2024-10-21 DIAGNOSIS — I2510 Atherosclerotic heart disease of native coronary artery without angina pectoris: Secondary | ICD-10-CM

## 2024-10-21 MED ORDER — LEVOTHYROXINE SODIUM 75 MCG PO TABS
75.0000 ug | ORAL_TABLET | Freq: Every day | ORAL | 0 refills | Status: AC
  Filled 2024-10-21: qty 30, 30d supply, fill #0

## 2024-10-21 MED ORDER — ROSUVASTATIN CALCIUM 10 MG PO TABS
10.0000 mg | ORAL_TABLET | Freq: Every day | ORAL | 3 refills | Status: DC
  Filled 2024-10-21 – 2024-11-14 (×3): qty 90, 90d supply, fill #0

## 2024-10-21 NOTE — Telephone Encounter (Signed)
 Refill for levothyroxine for 30 day supply with 0 refills sent to the pts confirmed pharmacy of choice.

## 2024-10-21 NOTE — Telephone Encounter (Signed)
-----   Message from Reche Finder, NP sent at 10/21/2024 12:03 PM EST ----- Normal kidneys, liver, electrolyte.  Cholesterol improved from previous. LDL (bad cholesterol) is improved from 126 to 81 but not yet at goal less than 70.  Recommend changing rosuvastatin  to 10 mg  daily and repeat FLP/ALT in 3 months.  However given reported leg cramps if he would instead like to continue Rosuvastatin  10mg  three times per week and add Zetia 10mg  daily (non-statin cholesterol medication) with repeat labs in 3 months  that is fine.

## 2024-10-21 NOTE — Addendum Note (Signed)
 Addended by: GLADIS PORTER HERO on: 10/21/2024 01:05 PM   Modules accepted: Orders

## 2024-10-21 NOTE — Telephone Encounter (Signed)
 The patient has been notified of the result and verbalized understanding.  All questions (if any) were answered.  Pt states he will increase his crestor  to 10 mg po daily and mark down to come in for repeat lipids/ALT in 3 months.  He is aware to go fasting to that lab appointment.  Confirmed the pharmacy of choice with the pt.   Pt is also asking if Reche Finder, NP would refill his synthroid, for he is trying to get in with his PCP and is almost out of this medication.   Will ask Caitlin if ok to refill the levothyroxine.   Pt verbalized understanding and agrees with this plan.

## 2024-10-22 ENCOUNTER — Other Ambulatory Visit (HOSPITAL_COMMUNITY): Payer: Self-pay

## 2024-10-22 ENCOUNTER — Other Ambulatory Visit: Payer: Self-pay

## 2024-10-22 ENCOUNTER — Other Ambulatory Visit (HOSPITAL_BASED_OUTPATIENT_CLINIC_OR_DEPARTMENT_OTHER): Payer: Self-pay

## 2024-11-04 ENCOUNTER — Other Ambulatory Visit (HOSPITAL_COMMUNITY): Payer: Self-pay

## 2024-11-05 ENCOUNTER — Other Ambulatory Visit: Payer: Self-pay

## 2024-11-05 ENCOUNTER — Other Ambulatory Visit (HOSPITAL_COMMUNITY): Payer: Self-pay

## 2024-11-15 ENCOUNTER — Other Ambulatory Visit (HOSPITAL_COMMUNITY): Payer: Self-pay

## 2024-11-15 ENCOUNTER — Encounter (HOSPITAL_COMMUNITY): Payer: Self-pay

## 2024-11-15 ENCOUNTER — Other Ambulatory Visit: Payer: Self-pay

## 2024-11-15 MED ORDER — EZETIMIBE 10 MG PO TABS
10.0000 mg | ORAL_TABLET | Freq: Every day | ORAL | 3 refills | Status: AC
  Filled 2024-11-15: qty 90, 90d supply, fill #0

## 2024-11-15 MED ORDER — ROSUVASTATIN CALCIUM 10 MG PO TABS
10.0000 mg | ORAL_TABLET | ORAL | 3 refills | Status: AC
  Filled 2024-11-15 (×2): qty 45, 105d supply, fill #0

## 2024-11-15 NOTE — Telephone Encounter (Signed)
 Per result note 10/21/24:  - Message from Reche Finder, NP sent at 10/21/2024 12:03 PM EST ----- Normal kidneys, liver, electrolyte.  Cholesterol improved from previous. LDL (bad cholesterol) is improved from 126 to 81 but not yet at goal less than 70.  Recommend changing rosuvastatin  to 10 mg  daily and repeat FLP/ALT in 3 months.   However given reported leg cramps if he would instead like to continue Rosuvastatin  10mg  three times per week and add Zetia  10mg  daily (non-statin cholesterol medication) with repeat labs in 3 months  that is fine.     Changed rosuvastatin  back to 10 mg three times per week and sent new prescription for zetia  10 mg daily.  Pt notified by mychart reply.

## 2024-11-15 NOTE — Addendum Note (Signed)
 Addended by: Anabell Swint on: 11/15/2024 03:01 PM   Modules accepted: Orders
